# Patient Record
Sex: Female | Born: 1975 | Race: White | Hispanic: No | Marital: Married | Smoking: Never smoker
Health system: Southern US, Community
[De-identification: ages and names within clinical notes are randomized; demographics above are authoritative.]

## PROBLEM LIST (undated history)

## (undated) DIAGNOSIS — F329 Major depressive disorder, single episode, unspecified: Secondary | ICD-10-CM

## (undated) DIAGNOSIS — Z789 Other specified health status: Secondary | ICD-10-CM

## (undated) DIAGNOSIS — F32A Depression, unspecified: Secondary | ICD-10-CM

## (undated) HISTORY — PX: DILATION AND CURETTAGE OF UTERUS: SHX78

## (undated) HISTORY — PX: APPENDECTOMY: SHX54

## (undated) HISTORY — PX: CLOSED REDUCTION RADIAL / ULNAR SHAFT FRACTURE: SUR237

---

## 2003-01-27 ENCOUNTER — Other Ambulatory Visit: Payer: Self-pay

## 2006-04-16 ENCOUNTER — Encounter: Payer: Self-pay | Admitting: Obstetrics & Gynecology

## 2006-04-16 ENCOUNTER — Ambulatory Visit: Payer: Self-pay | Admitting: Obstetrics & Gynecology

## 2007-01-25 ENCOUNTER — Emergency Department (HOSPITAL_COMMUNITY): Admission: EM | Admit: 2007-01-25 | Discharge: 2007-01-25 | Payer: Self-pay | Admitting: Emergency Medicine

## 2007-01-25 ENCOUNTER — Ambulatory Visit: Payer: Self-pay | Admitting: Orthopedic Surgery

## 2007-01-26 ENCOUNTER — Ambulatory Visit: Payer: Self-pay | Admitting: Orthopedic Surgery

## 2007-01-26 DIAGNOSIS — S53106A Unspecified dislocation of unspecified ulnohumeral joint, initial encounter: Secondary | ICD-10-CM | POA: Insufficient documentation

## 2007-01-27 ENCOUNTER — Encounter: Payer: Self-pay | Admitting: Orthopedic Surgery

## 2007-01-27 ENCOUNTER — Inpatient Hospital Stay (HOSPITAL_COMMUNITY): Admission: RE | Admit: 2007-01-27 | Discharge: 2007-01-29 | Payer: Self-pay | Admitting: Orthopedic Surgery

## 2007-02-02 ENCOUNTER — Encounter: Payer: Self-pay | Admitting: Orthopedic Surgery

## 2007-02-09 ENCOUNTER — Encounter: Payer: Self-pay | Admitting: Orthopedic Surgery

## 2007-09-15 ENCOUNTER — Ambulatory Visit: Payer: Self-pay | Admitting: Nurse Practitioner

## 2010-01-23 ENCOUNTER — Emergency Department (HOSPITAL_COMMUNITY)
Admission: EM | Admit: 2010-01-23 | Discharge: 2010-01-23 | Payer: Self-pay | Source: Home / Self Care | Admitting: Emergency Medicine

## 2010-04-10 ENCOUNTER — Other Ambulatory Visit: Payer: Self-pay | Admitting: Obstetrics & Gynecology

## 2010-04-10 DIAGNOSIS — Z0489 Encounter for examination and observation for other specified reasons: Secondary | ICD-10-CM

## 2010-04-24 ENCOUNTER — Ambulatory Visit (HOSPITAL_COMMUNITY)
Admission: RE | Admit: 2010-04-24 | Discharge: 2010-04-24 | Disposition: A | Discharge status: Home / Self Care | Payer: Medicaid Other | Source: Ambulatory Visit | Attending: Obstetrics & Gynecology | Admitting: Obstetrics & Gynecology

## 2010-04-24 ENCOUNTER — Encounter (HOSPITAL_COMMUNITY): Payer: Self-pay

## 2010-04-24 DIAGNOSIS — Z0489 Encounter for examination and observation for other specified reasons: Secondary | ICD-10-CM

## 2010-04-24 DIAGNOSIS — O358XX0 Maternal care for other (suspected) fetal abnormality and damage, not applicable or unspecified: Secondary | ICD-10-CM | POA: Insufficient documentation

## 2010-04-24 DIAGNOSIS — O09529 Supervision of elderly multigravida, unspecified trimester: Secondary | ICD-10-CM | POA: Insufficient documentation

## 2010-04-24 LAB — BASIC METABOLIC PANEL
BUN: 8 mg/dL (ref 6–23)
CO2: 24 mEq/L (ref 19–32)
Calcium: 8.9 mg/dL (ref 8.4–10.5)
Glucose, Bld: 98 mg/dL (ref 70–99)

## 2010-04-25 ENCOUNTER — Encounter: Payer: Self-pay | Admitting: Advanced Practice Midwife

## 2010-04-25 DIAGNOSIS — IMO0002 Reserved for concepts with insufficient information to code with codable children: Secondary | ICD-10-CM

## 2010-04-25 DIAGNOSIS — O280 Abnormal hematological finding on antenatal screening of mother: Secondary | ICD-10-CM | POA: Insufficient documentation

## 2010-04-25 DIAGNOSIS — O039 Complete or unspecified spontaneous abortion without complication: Secondary | ICD-10-CM | POA: Insufficient documentation
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
## 2010-06-26 NOTE — Op Note (Signed)
Amanda Roth, Amanda Roth             ACCOUNT NO.:  [id number]   MEDICAL RECORD NO.:  [id number]          PATIENT TYPE:  OIB   LOCATION:  3011                         FACILITY:  [facility]   PHYSICIAN:  Dionne Ano. Gramig III, M.D.DATE OF BIRTH:  [birth year]   DATE OF PROCEDURE:  01/27/2007  DATE OF DISCHARGE:                               OPERATIVE REPORT   PREOPERATIVE DIAGNOSIS:  Fracture dislocation of left elbow with  comminuted radial head and instability.   POSTOPERATIVE DIAGNOSIS:  Fracture dislocation of left elbow with  comminuted radial head and instability.   PROCEDURE:  1. Open reduction, left elbow dislocation.  2. Radial head arthroplasty, left elbow.  3. Arthrotomy with loose body removal in the form of multiple bone      chips, left elbow.  4. Lateral ulnar collateral ligament repair, left elbow.  5. Stress radiography, left upper extremity.   SURGEON:  Dominica Severin, MD   ASSISTANT:  Karie Chimera, PA-C   COMPLICATIONS:  None.   ANESTHESIA:  General.   DRAINS:  None.   ESTIMATED BLOOD LOSS:  Minimal.   INDICATIONS FOR PROCEDURE:  Amanda Roth is a 35 year old female who  was referred by Dr. Fuller Canada of Orthopedics in [place] at  [facility] after the patient sustained a severe fracture  dislocation to the elbow.  I was asked to take over her care for her  upper extremity predicament as noted above.  She has an unstable  elbow/comminuted radial head fracture and I discussed with her the risks  and benefits of surgery and options for treatment.  I discussed the risk  of heterotopic ossification, bleeding, infection, anesthesia, damage to  normal structures and failure of surgery to accomplish its intended  goals of relieving symptoms and restoring function.  All issues were  addressed preoperatively.   OPERATION IN DETAIL:  The patient was seen by myself and anesthesia,  taken to the operative suite and underwent a smooth induction of  general  anesthesia.  Permit was signed.  Arm was marked.  Extensive preoperative  counseling and preoperative antibiotics were placed.  Following this,  the patient was prepped and draped in the usual sterile fashion after  general anesthesia was secured and a Kocher incision was made about the  left elbow after a sterile field was secured.  Once this was completed,  I then dissected down and interval between the anconeus and ECU was  created.  Dissection was carried down and the lateral ulnar collateral  ligament was identified and was torn.  Following this, I then performed  identification of the joint and arthrotomy was made.  I very carefully  placed retractors in the area and remove multiple loose bodies including  radial head fragments and a small loose body from the coronoid.  The  coronoid was generally stable, but had a slight chip off of the chondral  debris; the joint thus underwent arthrotomy with multiple loose body  removals including the radial head fragments.  Following lavage, I then  performed reduction of the elbow, as it was quite unstable, and assessed  the stability of the  coronoid.  Once this was complete, I then of course  made a decision to perform radial head arthroplasty.  The patient  underwent placement of an AcuMed size 6 stem, 20-mm head; this was done  in standard fashion utilizing a broach about the canal, followed by  sizing, trial sizing and final placement of the implant with atraumatic  technique.  This fit very nicely and I was quite pleased with this.  X-  rays were taken; AP and lateral and stress radiographs were reviewed,  which looked excellent in the AP and lateral plane.  I was pleased with  the cut, which was beveled, and all findings.  The patient tolerated  this very well.  Once this was done, I then performed copious irrigation  to the area and repaired the lateral ulnar collateral ligament with #2  FiberWire in an interrupted buried  fashion.   I should note that the oscillating saw was used to bevel the radial neck  and a planer was used as well according to standard technique.   Following repair of the ligament, I then performed closure of the subcu  with Vicryl and closure of the skin edge with subcuticular Prolene.  Sensorcaine without epinephrine was placed on the wound for postop  analgesia and she was placed in a long arm splint after sterile dressing  was applied with the elbow at 90 and the wrist in pronation (forearm in  pronation).   She will be admitted for observation, IV antibiotics, pain management  and we will monitor her condition closely.  I am going to have her  notify us should any problems occur.  Otherwise, we will plan to see her  back in the office in 10-14 days, at which time we will remove sutures  and embark on a therapy program.  I will allow her full flexion and  extension to 70 degrees and weekly we will bring her down 10-15 degrees  so that we get full extension by 6-8 weeks postop.  No strengthening  until she is limited to 12 weeks and no heavy lifting until 3 months  out.  This is a very severe fracture dislocation and I want to make sure  she does very well.  I am not going to restrictive her pronation and  supination initially, as I was quite pleased with the ligamentous repair  and do not want to get overly stiff.  All questions have been encouraged  and answered.           ______________________________  Dionne Ano. Everlene Other, M.D.     Nash Mantis  D:  01/27/2007  T:  01/28/2007  Job:  [id number]   cc:   Vickki Hearing, M.D.

## 2010-08-27 ENCOUNTER — Other Ambulatory Visit: Payer: Self-pay | Admitting: Obstetrics & Gynecology

## 2010-08-27 NOTE — H&P (Addendum)
Preoperative History and Physical  Amanda Roth is a 35 y.o. [id number] with E DD 09/29/2010 by early sonogram. She isadmitted for a primary caeserean section for complete placenta previa. She was in the hospital at [facility] for 2 weeks for 2 bleeding episodes.  She received Betamethasone x 2.  Per recommendation with the MFM staff, she is admitted for elective early c section.  PMH:   Negative  PSH:     Past Surgical History  Procedure Date  . Dilation and curettage of uterus   . Appendectomy   . Closed reduction radial / ulnar shaft fracture     POb/GynH:      OB History    Grav Para Term Preterm Abortions TAB SAB Ect Mult Living   4 2 2  0 1 0 0 0 0 2      SH:   History  Substance Use Topics  . Smoking status: Never Smoker   . Smokeless tobacco: Not on file  . Alcohol Use: No    FH:    Family History  Problem Relation Age of Onset  . Hypertension Other   . Diabetes type II Other   . Coronary artery disease Other      Allergies:   NKDA  Medications:      Prenatal Vitamins  Review of Systems: - History obtained from the patient General ROS: negative Cardiovascular ROS: no chest pain or dyspnea on exertion All other systems negative  PHYSICAL EXAM:    General: General appearance - alert, well appearing, and in no distress Neck - supple, no significant adenopathy Chest - clear to auscultation, no wheezes, rales or rhonchi, symmetric air entry Heart - normal rate, regular rhythm, normal S1, S2, no murmurs, rubs, clicks or gallops Abdomen - no CVA tenderness Gravid fundal; height 36 cm Pelvic - normal external genitalia, vulva, vagina, cervix, uterus and adnexa Neurological - alert, oriented, normal speech, no focal findings or movement disorder noted Extremities - peripheral pulses normal, no pedal edema, no clubbing or cyanosis, pedal edema 2 + Focused Gynecological Exam: normal external genitalia, vulva, vagina, cervix, uterus and adnexa, CERVIX: not  assessed  Labs: Prenatal:  O positive, Antibody screen negative, Rubella negative, HepBsAG negative, HIV negative x 2, Serology negative x 2, 2 hour glucola 77/137/125, Hgb/HCT 11.6/35.8, Pap normal  EKG: Orders placed during the hospital encounter of 01/23/10  . EKG    Imaging Studies: No results found.    Assessment: 1.  IUP at 36 weeks 2 days 2.  Complete Placenta previa  Plan: Primary caeserean section with bilateral tubal ligation.  Pt understands the risks of surgery including but not limited t  excessive bleeding requiring transfusion or reoperation, post-operative infection requiring prolonged hospitalization or re-hospitalization and antibiotic therapy, and damage to other organs including bladder, bowel, ureters and major vessels.   Also risks of prematurity.  EURE,LUTHER H

## 2010-08-29 ENCOUNTER — Other Ambulatory Visit: Payer: Self-pay | Admitting: Obstetrics & Gynecology

## 2010-08-31 ENCOUNTER — Encounter (HOSPITAL_COMMUNITY): Payer: Self-pay

## 2010-08-31 ENCOUNTER — Encounter (HOSPITAL_COMMUNITY)
Admission: RE | Admit: 2010-08-31 | Discharge: 2010-08-31 | Disposition: A | Discharge status: Home / Self Care | Payer: 59 | Source: Ambulatory Visit | Attending: Obstetrics & Gynecology | Admitting: Obstetrics & Gynecology

## 2010-08-31 HISTORY — DX: Other specified health status: Z78.9

## 2010-08-31 LAB — CBC
Hemoglobin: 11.7 g/dL — ABNORMAL LOW (ref 12.0–15.0)
MCH: 30.5 pg (ref 26.0–34.0)
MCHC: 33.5 g/dL (ref 30.0–36.0)
Platelets: 190 10*3/uL (ref 150–400)
RBC: 3.84 MIL/uL — ABNORMAL LOW (ref 3.87–5.11)
RDW: 14.5 % (ref 11.5–15.5)

## 2010-08-31 LAB — SURGICAL PCR SCREEN
MRSA, PCR: NEGATIVE
Staphylococcus aureus: NEGATIVE

## 2010-08-31 LAB — RPR: RPR Ser Ql: NONREACTIVE

## 2010-08-31 NOTE — Patient Instructions (Signed)
20 Amanda Roth  08/31/2010   Your procedure is scheduled on:  09/03/10  Report to [facility] at 0700 AM.  Call this number if you have problems the morning of surgery: [phone]   Remember:   Do not eat food:After Midnight.  Do not drink clear liquids: After Midnight.  Take these medicines the morning of surgery with A SIP OF WATER: none   Do not wear jewelry, make-up or nail polish.  Do not bring valuables to the hospital.  Contacts, dentures or bridgework may not be worn into surgery.  Leave suitcase in the car. After surgery it may be brought to your room.  For patients admitted to the hospital, checkout time is 11:00 AM the day of discharge.   Patients discharged the day of surgery will not be allowed to drive home.  Name and phone number of your driver: spouseCristal Deer- [phone]  Special Instructions: N/A   Please read over the following fact sheets that you were given:

## 2010-09-03 ENCOUNTER — Inpatient Hospital Stay (HOSPITAL_COMMUNITY)
Admission: RE | Admit: 2010-09-03 | Discharge: 2010-09-06 | DRG: 765 | Disposition: A | Discharge status: Home / Self Care | Payer: 59 | Source: Ambulatory Visit | Attending: Obstetrics & Gynecology | Admitting: Obstetrics & Gynecology

## 2010-09-03 ENCOUNTER — Encounter (HOSPITAL_COMMUNITY): Payer: Self-pay | Admitting: Cardiology

## 2010-09-03 ENCOUNTER — Encounter (HOSPITAL_COMMUNITY)
Admission: RE | Disposition: A | Discharge status: Home / Self Care | Payer: Self-pay | Source: Ambulatory Visit | Attending: Obstetrics & Gynecology

## 2010-09-03 ENCOUNTER — Encounter (HOSPITAL_COMMUNITY): Payer: Self-pay | Admitting: Anesthesiology

## 2010-09-03 ENCOUNTER — Inpatient Hospital Stay (HOSPITAL_COMMUNITY): Payer: 59 | Admitting: Anesthesiology

## 2010-09-03 ENCOUNTER — Other Ambulatory Visit: Payer: Self-pay | Admitting: Obstetrics & Gynecology

## 2010-09-03 DIAGNOSIS — O4400 Placenta previa specified as without hemorrhage, unspecified trimester: Secondary | ICD-10-CM

## 2010-09-03 DIAGNOSIS — O09529 Supervision of elderly multigravida, unspecified trimester: Secondary | ICD-10-CM

## 2010-09-03 DIAGNOSIS — Z302 Encounter for sterilization: Secondary | ICD-10-CM

## 2010-09-03 LAB — PREPARE RBC (CROSSMATCH)

## 2010-09-03 SURGERY — Surgical Case
Anesthesia: Regional | Laterality: Bilateral

## 2010-09-03 MED ORDER — OXYTOCIN 10 UNIT/ML IJ SOLN
INTRAMUSCULAR | Status: AC
  Filled 2010-09-03: qty 2

## 2010-09-03 MED ORDER — DIPHENHYDRAMINE HCL 50 MG/ML IJ SOLN: 12.5000 mg | INTRAMUSCULAR | Status: DC | PRN

## 2010-09-03 MED ORDER — PHENYLEPHRINE 40 MCG/ML (10ML) SYRINGE FOR IV PUSH (FOR BLOOD PRESSURE SUPPORT)
PREFILLED_SYRINGE | INTRAVENOUS | Status: AC
  Filled 2010-09-03: qty 10

## 2010-09-03 MED ORDER — PRENATAL PLUS 27-1 MG PO TABS
1.0000 | ORAL_TABLET | Freq: Every day | ORAL | Status: DC
  Administered 2010-09-04 – 2010-09-06 (×3): 1 via ORAL
  Filled 2010-09-03 (×2): qty 1

## 2010-09-03 MED ORDER — SIMETHICONE 80 MG PO CHEW
80.0000 mg | CHEWABLE_TABLET | Freq: Three times a day (TID) | ORAL | Status: DC
  Administered 2010-09-03 – 2010-09-06 (×8): 80 mg via ORAL

## 2010-09-03 MED ORDER — EPHEDRINE SULFATE 50 MG/ML IJ SOLN
INTRAMUSCULAR | Status: DC | PRN
  Administered 2010-09-03: 10 mg via INTRAVENOUS
  Administered 2010-09-03: 5 mg via INTRAVENOUS
  Administered 2010-09-03: 10 mg via INTRAVENOUS
  Administered 2010-09-03: 5 mg via INTRAVENOUS

## 2010-09-03 MED ORDER — TETANUS-DIPHTH-ACELL PERTUSSIS 5-2.5-18.5 LF-MCG/0.5 IM SUSP
0.5000 mL | Freq: Once | INTRAMUSCULAR | Status: AC
  Administered 2010-09-04: 0.5 mL via INTRAMUSCULAR
  Filled 2010-09-03: qty 0.5

## 2010-09-03 MED ORDER — PHENYLEPHRINE HCL 10 MG/ML IJ SOLN
INTRAMUSCULAR | Status: DC | PRN
  Administered 2010-09-03 (×6): 40 ug via INTRAVENOUS

## 2010-09-03 MED ORDER — OXYCODONE-ACETAMINOPHEN 5-325 MG PO TABS
1.0000 | ORAL_TABLET | ORAL | Status: DC | PRN
  Administered 2010-09-04 – 2010-09-05 (×3): 1 via ORAL
  Filled 2010-09-03 (×3): qty 1

## 2010-09-03 MED ORDER — EPHEDRINE 5 MG/ML INJ
INTRAVENOUS | Status: AC
  Filled 2010-09-03: qty 10

## 2010-09-03 MED ORDER — NALBUPHINE SYRINGE 5 MG/0.5 ML
5.0000 mg | INJECTION | INTRAMUSCULAR | Status: AC | PRN
  Filled 2010-09-03: qty 1

## 2010-09-03 MED ORDER — OXYTOCIN 10 UNIT/ML IJ SOLN
INTRAMUSCULAR | Status: DC | PRN
  Administered 2010-09-03 (×2): 20 [IU] via INTRAMUSCULAR

## 2010-09-03 MED ORDER — SODIUM CHLORIDE 0.9 % IR SOLN
Status: DC | PRN
  Administered 2010-09-03: 1000 mL

## 2010-09-03 MED ORDER — NALBUPHINE SYRINGE 5 MG/0.5 ML
5.0000 mg | INJECTION | INTRAMUSCULAR | Status: AC | PRN
  Administered 2010-09-03: 5 mg via INTRAVENOUS
  Filled 2010-09-03: qty 1

## 2010-09-03 MED ORDER — CEFAZOLIN SODIUM 1-5 GM-% IV SOLN: 1.0000 g | INTRAVENOUS | Status: DC

## 2010-09-03 MED ORDER — SENNOSIDES-DOCUSATE SODIUM 8.6-50 MG PO TABS
1.0000 | ORAL_TABLET | Freq: Every day | ORAL | Status: DC
  Administered 2010-09-03 – 2010-09-05 (×3): 2 via ORAL

## 2010-09-03 MED ORDER — SODIUM CHLORIDE 0.9 % IV SOLN
1.0000 ug/kg/h | INTRAVENOUS | Status: DC | PRN
  Filled 2010-09-03: qty 2.5

## 2010-09-03 MED ORDER — MENTHOL 3 MG MT LOZG: 1.0000 | LOZENGE | OROMUCOSAL | Status: DC | PRN

## 2010-09-03 MED ORDER — MEPERIDINE HCL 25 MG/ML IJ SOLN: 6.2500 mg | INTRAMUSCULAR | Status: DC | PRN

## 2010-09-03 MED ORDER — IBUPROFEN 600 MG PO TABS
600.0000 mg | ORAL_TABLET | Freq: Four times a day (QID) | ORAL | Status: DC
  Administered 2010-09-04 – 2010-09-06 (×11): 600 mg via ORAL
  Filled 2010-09-03 (×11): qty 1

## 2010-09-03 MED ORDER — WITCH HAZEL-GLYCERIN EX PADS: MEDICATED_PAD | CUTANEOUS | Status: DC | PRN

## 2010-09-03 MED ORDER — SIMETHICONE 80 MG PO CHEW: 80.0000 mg | CHEWABLE_TABLET | ORAL | Status: DC | PRN

## 2010-09-03 MED ORDER — ONDANSETRON HCL 4 MG/2ML IJ SOLN: 4.0000 mg | Freq: Three times a day (TID) | INTRAMUSCULAR | Status: DC | PRN

## 2010-09-03 MED ORDER — SODIUM CHLORIDE 0.9 % IJ SOLN: 3.0000 mL | INTRAMUSCULAR | Status: DC | PRN

## 2010-09-03 MED ORDER — ONDANSETRON HCL 4 MG/2ML IJ SOLN: 4.0000 mg | INTRAMUSCULAR | Status: DC | PRN

## 2010-09-03 MED ORDER — CEFAZOLIN SODIUM 1-5 GM-% IV SOLN
INTRAVENOUS | Status: AC
  Administered 2010-09-03: 1 g via INTRAVENOUS
  Filled 2010-09-03: qty 50

## 2010-09-03 MED ORDER — KETOROLAC TROMETHAMINE 60 MG/2ML IM SOLN
INTRAMUSCULAR | Status: AC
  Administered 2010-09-03: 60 mg via INTRAMUSCULAR
  Filled 2010-09-03: qty 2

## 2010-09-03 MED ORDER — BUPIVACAINE HCL 0.75 % IJ SOLN
INTRAMUSCULAR | Status: DC | PRN
  Administered 2010-09-03: 11.25 mg via INTRATHECAL

## 2010-09-03 MED ORDER — KETOROLAC TROMETHAMINE 60 MG/2ML IM SOLN
60.0000 mg | Freq: Once | INTRAMUSCULAR | Status: AC | PRN
  Administered 2010-09-03: 60 mg via INTRAMUSCULAR

## 2010-09-03 MED ORDER — NALOXONE HCL 0.4 MG/ML IJ SOLN: 0.4000 mg | INTRAMUSCULAR | Status: DC | PRN

## 2010-09-03 MED ORDER — DIPHENHYDRAMINE HCL 50 MG/ML IJ SOLN: 25.0000 mg | INTRAMUSCULAR | Status: DC | PRN

## 2010-09-03 MED ORDER — MORPHINE SULFATE 0.5 MG/ML IJ SOLN
INTRAMUSCULAR | Status: AC
  Filled 2010-09-03: qty 10

## 2010-09-03 MED ORDER — SCOPOLAMINE 1 MG/3DAYS TD PT72
1.0000 | MEDICATED_PATCH | TRANSDERMAL | Status: DC
  Administered 2010-09-03: 1.5 mg via TRANSDERMAL

## 2010-09-03 MED ORDER — METOCLOPRAMIDE HCL 5 MG/ML IJ SOLN
INTRAMUSCULAR | Status: AC
  Administered 2010-09-03: 10 mg via INTRAVENOUS
  Filled 2010-09-03: qty 2

## 2010-09-03 MED ORDER — LACTATED RINGERS IV SOLN
INTRAVENOUS | Status: DC
  Administered 2010-09-03: 22:00:00 via INTRAVENOUS

## 2010-09-03 MED ORDER — KETOROLAC TROMETHAMINE 30 MG/ML IJ SOLN: 30.0000 mg | Freq: Four times a day (QID) | INTRAMUSCULAR | Status: AC | PRN

## 2010-09-03 MED ORDER — ZOLPIDEM TARTRATE 5 MG PO TABS: 5.0000 mg | ORAL_TABLET | Freq: Every evening | ORAL | Status: DC | PRN

## 2010-09-03 MED ORDER — FENTANYL CITRATE 0.05 MG/ML IJ SOLN
INTRAMUSCULAR | Status: AC
  Filled 2010-09-03: qty 2

## 2010-09-03 MED ORDER — ONDANSETRON HCL 4 MG/2ML IJ SOLN
INTRAMUSCULAR | Status: DC | PRN
  Administered 2010-09-03: 4 mg via INTRAVENOUS

## 2010-09-03 MED ORDER — IBUPROFEN 600 MG PO TABS
600.0000 mg | ORAL_TABLET | Freq: Four times a day (QID) | ORAL | Status: DC | PRN
  Filled 2010-09-03: qty 1

## 2010-09-03 MED ORDER — DIPHENHYDRAMINE HCL 25 MG PO CAPS
25.0000 mg | ORAL_CAPSULE | ORAL | Status: DC | PRN
  Filled 2010-09-03: qty 1

## 2010-09-03 MED ORDER — ONDANSETRON HCL 4 MG/2ML IJ SOLN
INTRAMUSCULAR | Status: AC
  Filled 2010-09-03: qty 2

## 2010-09-03 MED ORDER — ONDANSETRON HCL 4 MG PO TABS: 4.0000 mg | ORAL_TABLET | ORAL | Status: DC | PRN

## 2010-09-03 MED ORDER — LACTATED RINGERS IV SOLN
INTRAVENOUS | Status: DC
  Administered 2010-09-03 (×3): via INTRAVENOUS
  Administered 2010-09-03: 20 mL/h via INTRAVENOUS
  Administered 2010-09-03 (×2): via INTRAVENOUS

## 2010-09-03 MED ORDER — MORPHINE SULFATE (PF) 0.5 MG/ML IJ SOLN
INTRAMUSCULAR | Status: DC | PRN
  Administered 2010-09-03: .1 mg via INTRATHECAL
  Administered 2010-09-03: 4.9 mg via INTRAVENOUS

## 2010-09-03 MED ORDER — OXYTOCIN 20 UNITS IN LACTATED RINGERS INFUSION - SIMPLE: 125.0000 mL/h | INTRAVENOUS | Status: AC

## 2010-09-03 MED ORDER — DIPHENHYDRAMINE HCL 25 MG PO CAPS: 25.0000 mg | ORAL_CAPSULE | Freq: Four times a day (QID) | ORAL | Status: DC | PRN

## 2010-09-03 MED ORDER — METOCLOPRAMIDE HCL 5 MG/ML IJ SOLN: 10.0000 mg | Freq: Three times a day (TID) | INTRAMUSCULAR | Status: DC | PRN

## 2010-09-03 MED ORDER — FENTANYL CITRATE 0.05 MG/ML IJ SOLN
INTRAMUSCULAR | Status: DC | PRN
  Administered 2010-09-03: 15 ug via INTRATHECAL
  Administered 2010-09-03: 85 ug via INTRAVENOUS

## 2010-09-03 SURGICAL SUPPLY — 32 items
CLOTH BEACON ORANGE TIMEOUT ST (SAFETY) ×2 IMPLANT
DERMABOND ADVANCED (GAUZE/BANDAGES/DRESSINGS) ×4 IMPLANT
DRAPE UTILITY XL STRL (DRAPES) ×2 IMPLANT
DURAPREP 26ML APPLICATOR (WOUND CARE) ×4 IMPLANT
ELECT REM PT RETURN 9FT ADLT (ELECTROSURGICAL) ×2
ELECTRODE REM PT RTRN 9FT ADLT (ELECTROSURGICAL) ×1 IMPLANT
EXTRACTOR VACUUM BELL STYLE (SUCTIONS) IMPLANT
EXTRACTOR VACUUM KIWI (MISCELLANEOUS) ×2 IMPLANT
GLOVE BIOGEL PI IND STRL 8 (GLOVE) ×2 IMPLANT
GLOVE BIOGEL PI INDICATOR 8 (GLOVE) ×2
GLOVE ECLIPSE 8.0 STRL XLNG CF (GLOVE) ×2 IMPLANT
GOWN BRE IMP SLV AUR XL STRL (GOWN DISPOSABLE) ×6 IMPLANT
KIT ABG SYR 3ML LUER SLIP (SYRINGE) ×2 IMPLANT
NDL HYPO 25X5/8 SAFETYGLIDE (NEEDLE) ×1 IMPLANT
NEEDLE HYPO 25X5/8 SAFETYGLIDE (NEEDLE) ×2 IMPLANT
NS IRRIG 1000ML POUR BTL (IV SOLUTION) ×2 IMPLANT
PACK C SECTION WH (CUSTOM PROCEDURE TRAY) ×2 IMPLANT
RTRCTR C-SECT PINK 25CM LRG (MISCELLANEOUS) IMPLANT
SLEEVE SCD COMPRESS KNEE MED (MISCELLANEOUS) IMPLANT
STAPLER VISISTAT 35W (STAPLE) IMPLANT
SUT CHROMIC 0 CT 1 (SUTURE) ×2 IMPLANT
SUT MNCRL 0 VIOLET CTX 36 (SUTURE) ×2 IMPLANT
SUT MONOCRYL 0 CTX 36 (SUTURE) ×2
SUT PLAIN 2 0 (SUTURE)
SUT PLAIN 2 0 XLH (SUTURE) IMPLANT
SUT PLAIN ABS 2-0 CT1 27XMFL (SUTURE) IMPLANT
SUT VIC AB 0 CTX 36 (SUTURE) ×2
SUT VIC AB 0 CTX36XBRD ANBCTRL (SUTURE) ×1 IMPLANT
SUT VIC AB 4-0 KS 27 (SUTURE) IMPLANT
TOWEL OR 17X24 6PK STRL BLUE (TOWEL DISPOSABLE) ×4 IMPLANT
TRAY FOLEY CATH 14FR (SET/KITS/TRAYS/PACK) IMPLANT
WATER STERILE IRR 1000ML POUR (IV SOLUTION) ×2 IMPLANT

## 2010-09-03 NOTE — Anesthesia Procedure Notes (Signed)
Spinal Block  Patient location during procedure: OR Start time: 09/03/2010 9:52 AM Staffing Anesthesiologist: CASSIDY, AMY L. Performed by: anesthesiologist  Preanesthetic Checklist Completed: patient identified, site marked, surgical consent, pre-op evaluation, timeout performed, IV checked, risks and benefits discussed and monitors and equipment checked Spinal Block Patient position: sitting Prep: DuraPrep Patient monitoring: heart rate, cardiac monitor, continuous pulse ox and blood pressure Approach: midline Location: L3-4 Injection technique: single-shot Needle Needle type: Sprotte  Needle gauge: 24 G Needle length: 5 cm Assessment Sensory level: T4

## 2010-09-03 NOTE — Anesthesia Postprocedure Evaluation (Signed)
  Anesthesia Post-op Note  Patient: Amanda Roth  Procedure(s) Performed:  CESAREAN SECTION WITH BILATERAL TUBAL LIGATION   Patient is awake, responsive, moving her legs, and has signs of resolution of her numbness. Pain and nausea are reasonably well controlled. Vital signs are stable and clinically acceptable. Oxygen saturation is clinically acceptable. There are no apparent anesthetic complications at this time. Patient is ready for discharge.

## 2010-09-03 NOTE — Procedures (Signed)
Cesarean Section Procedure Note   Amanda Roth  09/03/2010  Indications: Placenta Previa   Pre-operative Diagnosis: 36 weeks complete placenta previa.   Post-operative Diagnosis: Same   Surgeon: Surgeon(s) and Role:    * Scheryl Darter, MD - Primary   Assistants: Lucina Mellow, DO  Anesthesia: spinal   Procedure Details:  The patient was seen in the Holding Room. The risks, benefits, complications, treatment options, and expected outcomes were discussed with the patient. The patient concurred with the proposed plan, giving informed consent. identified as [place] Elders and the procedure verified as C-Section Delivery. A Time Out was held and the above information confirmed.  After induction of anesthesia, the patient was draped and prepped in the usual sterile manner. A transverse was made and carried down through the subcutaneous tissue to the fascia. Fascial incision was made and extended transversely. The fascia was separated from the underlying rectus tissue superiorly and inferiorly. The peritoneum was identified and entered. Peritoneal incision was extended longitudinally. The utero-vesical peritoneal reflection was incised transversely and the bladder flap was bluntly freed from the lower uterine segment. A low transverse uterine incision was made. Delivered from cephalic presentation was a 6 lbs 1.4 oz Living newborn infant femalle with Apgar scores of 1 at one minute and 9 at five minutes. Cord ph was not sent the umbilical cord was clamped and cut cord blood was obtained for evaluation. The placenta was removed Intact and appeared normal. The uterine outline, tubes and ovaries appeared normal}. The uterine incision was closed with running locked sutures of 0Vicryl. A second layer of the same was done and hemostasis was observed.  Attention was then turned to the patient's Left ovary and tube; the tube was followed out to the fimbria with Babcock clamps. A Filshie clip was placed,  hemostasis was noted. The patient's Right ovary and tube were then identified in similar fashion, grasped with Babcock clamps, and a Filshie clip was placed. Hemostasis was noted, and tube was returned to the abdomen.  The peritoneum was closed with 2-0 Vicryl. The fascia was then reapproximated with running sutures of 0 Vicryl. The subcuticular closure was performed using 2plain gut. The skin was closed with 4-0Vicryl.   Instrument, sponge, and needle counts were correct prior the abdominal closure and were correct at the conclusion of the case.    Findings: Viable infant female, weighing 6 lbs 1.4 oz, normal uterus, tubes and ovaries.    Estimated Blood Loss:  Total IV Fluids:   Urine Output: 200CC OF clear urine  Specimens:  Specimens    Placenta to pathology       Complications: no complications  Disposition: PACU - hemodynamically stable.   Maternal Condition: stable   Baby condition / location:  nursery-stable  Attending Attestation: I was present and scrubbed for the entire procedure.   Signed: Surgeon(s): Scheryl Darter, MD

## 2010-09-03 NOTE — Transfer of Care (Signed)
Immediate Anesthesia Transfer of Care Note  Patient: Amanda Roth  Procedure(s) Performed:  CESAREAN SECTION WITH BILATERAL TUBAL LIGATION  Patient Location: PACU  Anesthesia Type: Spinal  Level of Consciousness: awake, alert  and oriented  Airway & Oxygen Therapy: Patient Spontanous Breathing  Post-op Assessment: Report given to PACU RN and Post -op Vital signs reviewed and stable  Post vital signs: Reviewed and stable  Complications: No apparent anesthesia complications

## 2010-09-03 NOTE — Consult Note (Signed)
Called to attend scheduled repeat C/section at 36+ wks EGA for 35 y.o. [id number] with E DD 09/29/2010 by early sonogram with complete placenta previa. She was in the hospital at [facility] for 2 weeks for 2 bleeding episodes. She received Betamethasone x 2. Per recommendation with the MFM staff, she is admitted for elective early c section.  AROM with clear fluid at delivery, vertex extraction with loose nuchal cord x 2.  Infant depressed at birth with hypotonia and apnea, HR < 100.  No improvement after stimulation and bulb suction. PPV given x 1 minute via bag/mask, with immediate increase in HR to > 100 followed shortly by onset of respiratory effort and crying.  By 5 minutes good tone, etc. Apgars 1/9.  Shown to mother then taken to CN for further observation, care per Peds Teaching Service (f/u [place]).  JWimmer,MD

## 2010-09-03 NOTE — Anesthesia Preprocedure Evaluation (Addendum)
Anesthesia Evaluation  Name, MR# and DOB Patient awake  General Assessment Comment  Reviewed: Allergy & Precautions, H&P  and Patient's Chart, lab work & pertinent test results  History of Anesthesia Complications Negative for: history of anesthetic complications  Airway Mallampati: III TM Distance: >3 FB Neck ROM: full    Dental  (+) Teeth Intact   Pulmonaryneg pulmonary ROS      pulmonary exam normal   Cardiovascular regular Normal   Neuro/PsychNegative Neurological ROS Negative Psych ROS  GI/Hepatic/Renal negative Liver ROS, and negative Renal ROS (+)  GERD (with pregnancy) Medicated and Controlled     Endo/Other  Negative Endocrine ROS (+)   Abdominal   Musculoskeletal  Hematology negative hematology ROS (+)   Peds  Reproductive/Obstetrics (+) Pregnancy   Anesthesia Other Findings             Anesthesia Physical Anesthesia Plan  ASA: II  Anesthesia Plan: Spinal   Post-op Pain Management:    Induction:   Airway Management Planned:   Additional Equipment:   Intra-op Plan:   Post-operative Plan:   Informed Consent:   Plan Discussed with:   Anesthesia Plan Comments:         Anesthesia Quick Evaluation

## 2010-09-04 NOTE — Progress Notes (Signed)
Subjective: Postpartum Day 1: Cesarean Delivery Patient reports incisional pain and tolerating PO.    Objective: Vital signs in last 24 hours: Temp:  [97.3 F (36.3 C)-98.7 F (37.1 C)] 98.2 F (36.8 C) (07/24 0545) Pulse Rate:  [61-101] 73  (07/24 0545) Resp:  [16-20] 16  (07/24 0545) BP: (91-127)/(50-81) 93/50 mmHg (07/24 0545) SpO2:  [93 %-100 %] 95 % (07/24 0545) Weight:  [81.647 kg (180 lb)] 180 lb (81.647 kg) (07/23 1700)  Physical Exam:  General: alert, cooperative, appears stated age and no distress Lochia: appropriate Uterine Fundus: firm Incision: dressing dry and intact. DVT Evaluation: No evidence of DVT seen on physical exam. Negative Homan's sign. No cords or calf tenderness. No significant calf/ankle edema.  No results found for this basename: HGB:2,HCT:2 in the last 72 hours  Assessment/Plan: Status post Cesarean section. Doing well postoperatively.  Continue current care.  Amanda Roth 09/04/2010, 6:26 AM

## 2010-09-05 LAB — CBC
HCT: 26.1 % — ABNORMAL LOW (ref 36.0–46.0)
Hemoglobin: 8.5 g/dL — ABNORMAL LOW (ref 12.0–15.0)
MCV: 91.9 fL (ref 78.0–100.0)
RBC: 2.84 MIL/uL — ABNORMAL LOW (ref 3.87–5.11)
WBC: 13.4 10*3/uL — ABNORMAL HIGH (ref 4.0–10.5)

## 2010-09-05 NOTE — Progress Notes (Signed)
  Subjective: Postpartum Day 2: Cesarean Delivery Patient reports tolerating PO, + flatus and no problems voiding.  Has not yet had bowel movement. Abdominal cramping controled with medicine. Would like to breast feed. Denies any dizziness. Ambulating.   Objective: Vital signs in last 24 hours: Temp:  [97.8 F (36.6 C)-99.1 F (37.3 C)] 98.1 F (36.7 C) (07/25 0455) Pulse Rate:  [78-86] 78  (07/25 0455) Resp:  [18-19] 18  (07/25 0455) BP: (93-112)/(60-66) 95/61 mmHg (07/25 0455)  Physical Exam:  General: alert, cooperative and no distress Lochia: appropriate Uterine Fundus: firm, at umbilicus Incision: clean, dry, mild erythema, no discharge, no dehiscence. DVT Evaluation: No evidence of DVT seen on physical exam. Negative Homan's sign.  No results found for this basename: HGB:2,HCT:2 in the last 72 hours  Assessment/Plan: Status post Cesarean section. Doing well postoperatively.  Lactation consult.  Continue current care.  [name] 09/05/2010, 7:30 AM

## 2010-09-06 MED ORDER — BISACODYL 10 MG RE SUPP: 10.0000 mg | Freq: Every day | RECTAL | Status: DC | PRN

## 2010-09-06 MED ORDER — FERROUS SULFATE 325 (65 FE) MG PO TABS: 325.0000 mg | ORAL_TABLET | Freq: Two times a day (BID) | ORAL | Status: DC

## 2010-09-06 MED ORDER — OXYCODONE-ACETAMINOPHEN 5-325 MG PO TABS: 1.0000 | ORAL_TABLET | ORAL | Status: AC | PRN

## 2010-09-06 MED ORDER — IBUPROFEN 600 MG PO TABS: 600.0000 mg | ORAL_TABLET | Freq: Four times a day (QID) | ORAL | Status: AC

## 2010-09-06 NOTE — Progress Notes (Signed)
Mom has just finished BF baby. States BF going well. No concerns at present. Reports that breasts are feeling fuller this am. To call prn.

## 2010-09-06 NOTE — Discharge Summary (Signed)
  Obstetric Discharge Summary Reason for Admission: cesarean section with complete placenta previa Prenatal Procedures: ultrasound Intrapartum Procedures: cesarean: low cervical, transverse (primary) and BTL Postpartum Procedures: P.P. tubal ligation Complications-Operative and Postpartum: none  Hemoglobin  Date Value Range Status  09/05/2010 8.5* 12.0-15.0 (g/dL) Final     HCT  Date Value Range Status  09/05/2010 26.1* 36.0-46.0 (%) Final    Discharge Diagnoses: Pre-term pregnancy delivered  Discharge Information: Date: 09/06/2010 Activity: pelvic rest Diet: routine Medications: Ibuprophen, Colace, Iron and Percocet Condition: stable Instructions: refer to practice specific booklet Breastfeeding Discharge to: home Follow-up Information    Follow up with [facility] in 5 weeks. (for postpartum checkup)    Contact information:   [address] [place] [address]          Newborn Data: Live born  Information for the patient's newborn:  Lucendia, Leard [[id number]]  female ; APGAR 1 at and 9 at , ; weight 6lb14 ;  Home with mother.  [name] 09/06/2010, 8:29 AM

## 2010-09-06 NOTE — Progress Notes (Signed)
  Subjective: Postpartum Day 3 Cesarean Delivery and BTL Patient reports tolerating PO, + flatus and no problems voiding.  No BM since Sunday. Breastfeeding going well. Minimal pain with medicine. Lochia less than a period.   Objective: Vital signs in last 24 hours: Temp:  [98.1 F (36.7 C)-98.2 F (36.8 C)] 98.2 F (36.8 C) (07/26 0534) Pulse Rate:  [73-86] 81  (07/26 0534) Resp:  [17-18] 18  (07/26 0534) BP: (91-114)/(57-74) 114/72 mmHg (07/26 0534) SpO2:  [96 %] 96 % (07/25 0831)  Physical Exam:  General: alert, cooperative and no distress, tired Lochia: appropriate Uterine Fundus: firm, at umbilicus Incision: clean and dry. Mild erythema. No discharge or dehiscence. DVT Evaluation: No evidence of DVT seen on physical exam. Negative Homan's sign.   Basename 09/05/10 1040  HGB 8.5*  HCT 26.1*    Assessment/Plan: Status post Cesarean section. Doing well postoperatively.  Pain med: percocet and ibuprofen Discharge on iron and colace Constipation: dulcolax before discharge. Breastfeeding Contraception: BTL Discharge home with standard precautions and return to clinic in 4-6 weeks.  Amanda Roth 09/06/2010, 7:45 AM

## 2010-09-07 LAB — TYPE AND SCREEN
ABO/RH(D): O POS
Antibody Screen: NEGATIVE
Unit division: 0
Unit division: 0

## 2010-09-12 NOTE — Discharge Summary (Signed)
Agree with above note.  Jazzie Trampe A 09/12/2010 1:52 PM

## 2010-10-26 ENCOUNTER — Encounter (HOSPITAL_COMMUNITY): Payer: Self-pay | Admitting: *Deleted

## 2010-11-16 LAB — URINALYSIS, ROUTINE W REFLEX MICROSCOPIC
Glucose, UA: NEGATIVE
Protein, ur: NEGATIVE

## 2010-11-16 LAB — URINE MICROSCOPIC-ADD ON

## 2010-11-16 LAB — CBC
MCHC: 34.5
RBC: 4.31
WBC: 10.1

## 2010-11-16 LAB — BASIC METABOLIC PANEL
CO2: 25
Calcium: 8.9
Creatinine, Ser: 0.74
GFR calc Af Amer: >60

## 2011-01-15 ENCOUNTER — Other Ambulatory Visit: Payer: Self-pay | Admitting: Gastroenterology

## 2011-01-15 DIAGNOSIS — R1011 Right upper quadrant pain: Secondary | ICD-10-CM

## 2011-01-15 NOTE — Progress Notes (Signed)
Pt is scheduled for u/s 01/17/11@1 :15.  Pt is aware of appt.

## 2011-01-15 NOTE — Progress Notes (Signed)
RUQ pain for several months, worse with eating, associated with nausea. No reflux symptoms, dysphagia. Originally intermittent, now becoming more constant. Attempting to avoid fatty foods. Gallbladder in situ. Concerning for biliary component. Will proceed with [place] of abdomen.

## 2011-01-16 ENCOUNTER — Ambulatory Visit (HOSPITAL_COMMUNITY): Payer: 59

## 2011-01-22 ENCOUNTER — Ambulatory Visit (HOSPITAL_COMMUNITY): Payer: 59

## 2011-04-23 ENCOUNTER — Ambulatory Visit (HOSPITAL_COMMUNITY)
Admission: RE | Admit: 2011-04-23 | Discharge: 2011-04-23 | Disposition: A | Discharge status: Home / Self Care | Payer: 59 | Source: Ambulatory Visit | Attending: Gastroenterology | Admitting: Gastroenterology

## 2011-04-23 DIAGNOSIS — R1011 Right upper quadrant pain: Secondary | ICD-10-CM | POA: Insufficient documentation

## 2011-04-23 DIAGNOSIS — R932 Abnormal findings on diagnostic imaging of liver and biliary tract: Secondary | ICD-10-CM | POA: Insufficient documentation

## 2011-04-24 NOTE — Progress Notes (Signed)
Quick Note:  Discussed with pt. Current symptoms of bloating, intermittent indigestion, intermittent RUQ. At this point, biliary component still in differential, but I question underlying IBS, reflux.  Started on trial of Prilosec daily Avoid carbonated drinks, sugar substitutes such as aspartame High fiber diet Consider HIDA if continued RUQ pain.   ______

## 2011-07-11 ENCOUNTER — Other Ambulatory Visit: Payer: Self-pay | Admitting: Gastroenterology

## 2013-02-23 ENCOUNTER — Emergency Department (HOSPITAL_COMMUNITY)
Admission: EM | Admit: 2013-02-23 | Discharge: 2013-02-23 | Disposition: A | Discharge status: Home / Self Care | Payer: 59 | Attending: Emergency Medicine | Admitting: Emergency Medicine

## 2013-02-23 ENCOUNTER — Encounter (HOSPITAL_COMMUNITY): Payer: Self-pay | Admitting: Emergency Medicine

## 2013-02-23 DIAGNOSIS — M79609 Pain in unspecified limb: Secondary | ICD-10-CM | POA: Insufficient documentation

## 2013-02-23 DIAGNOSIS — F329 Major depressive disorder, single episode, unspecified: Secondary | ICD-10-CM | POA: Insufficient documentation

## 2013-02-23 DIAGNOSIS — M5430 Sciatica, unspecified side: Secondary | ICD-10-CM | POA: Insufficient documentation

## 2013-02-23 DIAGNOSIS — F3289 Other specified depressive episodes: Secondary | ICD-10-CM | POA: Insufficient documentation

## 2013-02-23 HISTORY — DX: Major depressive disorder, single episode, unspecified: F32.9

## 2013-02-23 HISTORY — DX: Depression, unspecified: F32.A

## 2013-02-23 MED ORDER — KETOROLAC TROMETHAMINE 60 MG/2ML IM SOLN
60.0000 mg | Freq: Once | INTRAMUSCULAR | Status: AC
  Administered 2013-02-23: 60 mg via INTRAMUSCULAR
  Filled 2013-02-23: qty 2

## 2013-02-23 MED ORDER — PREDNISONE 10 MG PO TABS: ORAL_TABLET | ORAL | Status: DC

## 2013-02-23 MED ORDER — CYCLOBENZAPRINE HCL 10 MG PO TABS: 10.0000 mg | ORAL_TABLET | Freq: Three times a day (TID) | ORAL | Status: DC | PRN

## 2013-02-23 MED ORDER — HYDROCODONE-ACETAMINOPHEN 5-325 MG PO TABS: ORAL_TABLET | ORAL | Status: DC

## 2013-02-23 MED ORDER — OXYCODONE-ACETAMINOPHEN 5-325 MG PO TABS
1.0000 | ORAL_TABLET | Freq: Once | ORAL | Status: AC
  Administered 2013-02-23: 1 via ORAL
  Filled 2013-02-23: qty 1

## 2013-02-23 MED ORDER — ONDANSETRON 8 MG PO TBDP
8.0000 mg | ORAL_TABLET | Freq: Once | ORAL | Status: AC
  Administered 2013-02-23: 8 mg via ORAL
  Filled 2013-02-23: qty 1

## 2013-02-23 NOTE — Discharge Instructions (Signed)
Back Pain, Adult Back pain is very common. The pain often gets better over time. The cause of back pain is usually not dangerous. Most people can learn to manage their back pain on their own.  HOME CARE   Stay active. Start with short walks on flat ground if you can. Try to walk farther each day.  Do not sit, drive, or stand in one place for more than 30 minutes. Do not stay in bed.  Do not avoid exercise or work. Activity can help your back heal faster.  Be careful when you bend or lift an object. Bend at your knees, keep the object close to you, and do not twist.  Sleep on a firm mattress. Lie on your side, and bend your knees. If you lie on your back, put a pillow under your knees.  Only take medicines as told by your doctor.  Put ice on the injured area.  Put ice in a plastic bag.  Place a towel between your skin and the bag.  Leave the ice on for 15-20 minutes, 03-04 times a day for the first 2 to 3 days. After that, you can switch between ice and heat packs.  Ask your doctor about back exercises or massage.  Avoid feeling anxious or stressed. Find good ways to deal with stress, such as exercise. GET HELP RIGHT AWAY IF:   Your pain does not go away with rest or medicine.  Your pain does not go away in 1 week.  You have new problems.  You do not feel well.  The pain spreads into your legs.  You cannot control when you poop (bowel movement) or pee (urinate).  Your arms or legs feel weak or lose feeling (numbness).  You feel sick to your stomach (nauseous) or throw up (vomit).  You have belly (abdominal) pain.  You feel like you may pass out (faint). MAKE SURE YOU:   Understand these instructions.  Will watch your condition.  Will get help right away if you are not doing well or get worse. Document Released: 07/17/2007 Document Revised: 04/22/2011 Document Reviewed: 06/18/2010 [facility] Patient Information 2014 [place].  Sciatica Sciatica is pain,  weakness, numbness, or tingling along your sciatic nerve. The nerve starts in the lower back and runs down the back of each leg. Nerve damage or certain conditions pinch or put pressure on the sciatic nerve. This causes the pain, weakness, and other discomforts of sciatica. HOME CARE   Only take medicine as told by your doctor.  Apply ice to the affected area for 20 minutes. Do this 3 4 times a day for the first 48 72 hours. Then try heat in the same way.  Exercise, stretch, or do your usual activities if these do not make your pain worse.  Go to physical therapy as told by your doctor.  Keep all doctor visits as told.  Do not wear high heels or shoes that are not supportive.  Get a firm mattress if your mattress is too soft to lessen pain and discomfort. GET HELP RIGHT AWAY IF:   You cannot control when you poop (bowel movement) or pee (urinate).  You have more weakness in your lower back, lower belly (pelvis), butt (buttocks), or legs.  You have redness or puffiness (swelling) of your back.  You have a burning feeling when you pee.  You have pain that gets worse when you lie down.  You have pain that wakes you from your sleep.  Your pain  is worse than past pain.  Your pain lasts longer than 4 weeks.  You are suddenly losing weight without reason. MAKE SURE YOU:   Understand these instructions.  Will watch this condition.  Will get help right away if you are not doing well or get worse. Document Released: 11/07/2007 Document Revised: 07/30/2011 Document Reviewed: 06/09/2011 [facility] Patient Information 2014 [place].

## 2013-02-23 NOTE — ED Provider Notes (Signed)
CSN: [id number]     Arrival date & time 02/23/13  1559 History   First MD Initiated Contact with Patient 02/23/13 1631     Chief Complaint  Patient presents with  . Back Pain   (Consider location/radiation/quality/duration/timing/severity/associated sxs/prior Treatment) Patient is a 38 y.o. female presenting with back pain. The history is provided by the patient and the spouse.  Back Pain Location:  Lumbar spine Quality:  Shooting and stabbing Radiates to:  R posterior upper leg, R thigh and R knee Pain severity:  Severe Pain is:  Same all the time Onset quality:  Sudden Duration:  2 days Timing:  Constant Progression:  Unchanged Chronicity:  New Context: lifting heavy objects and twisting   Context comment:  Patient states the pain began one day after she moved a matress.   Relieved by:  Nothing Worsened by:  Bending, ambulation, twisting and sitting Ineffective treatments:  Heating pad and NSAIDs Associated symptoms: leg pain   Associated symptoms: no abdominal pain, no abdominal swelling, no bladder incontinence, no bowel incontinence, no chest pain, no dysuria, no fever, no headaches, no numbness, no paresthesias, no pelvic pain, no perianal numbness, no tingling and no weakness     Past Medical History  Diagnosis Date  . No pertinent past medical history   . Depression    Past Surgical History  Procedure Laterality Date  . Dilation and curettage of uterus    . Appendectomy    . Closed reduction radial / ulnar shaft fracture    . Cesarean section     Family History  Problem Relation Age of Onset  . Hypertension Other   . Diabetes type II Other   . Coronary artery disease Other    History  Substance Use Topics  . Smoking status: Never Smoker   . Smokeless tobacco: Not on file  . Alcohol Use: No   OB History   Grav Para Term Preterm Abortions TAB SAB Ect Mult Living   4 3 2 1 1  0 0 0 0 3     Review of Systems  Constitutional: Negative for fever.    Respiratory: Negative for shortness of breath.   Cardiovascular: Negative for chest pain.  Gastrointestinal: Negative for vomiting, abdominal pain, constipation and bowel incontinence.  Genitourinary: Negative for bladder incontinence, dysuria, hematuria, flank pain, decreased urine volume, difficulty urinating and pelvic pain.       No perineal numbness or incontinence of urine or feces  Musculoskeletal: Positive for back pain. Negative for joint swelling.  Skin: Negative for rash.  Neurological: Negative for tingling, weakness, numbness, headaches and paresthesias.  All other systems reviewed and are negative.    Allergies  Review of patient's allergies indicates no known allergies.  Home Medications   Current Outpatient Rx  Name  Route  Sig  Dispense  Refill  . escitalopram (LEXAPRO) 10 MG tablet   Oral   Take 10 mg by mouth every evening.         Marland Kitchen ibuprofen (ADVIL,MOTRIN) 200 MG tablet   Oral   Take 200-600 mg by mouth every 6 (six) hours as needed.          BP 129/82  Pulse 99  Temp(Src) 98.2 F (36.8 C) (Oral)  Resp 16  Ht 5\' 2"  (1.575 m)  Wt 173 lb (78.472 kg)  BMI 31.63 kg/m2  SpO2 100%  LMP 02/01/2013 Physical Exam  Nursing note and vitals reviewed. Constitutional: She is oriented to person, place, and time. She appears well-developed  and well-nourished. No distress.  HENT:  Head: Normocephalic and atraumatic.  Neck: Normal range of motion. Neck supple.  Cardiovascular: Normal rate, regular rhythm, normal heart sounds and intact distal pulses.   No murmur heard. Pulmonary/Chest: Effort normal and breath sounds normal. No respiratory distress.  Abdominal: Soft. She exhibits no distension. There is no tenderness.  Musculoskeletal: She exhibits tenderness. She exhibits no edema.       Lumbar back: She exhibits tenderness and pain. She exhibits normal range of motion, no swelling, no deformity, no laceration and normal pulse.       Back:  ttp of the  right lumbar paraspinal muscles.  No spinal tenderness.  DP pulses are brisk and symmetrical.  Distal sensation intact.  Hip Flexors/Extensors are intact  Neurological: She is alert and oriented to person, place, and time. She has normal strength. No sensory deficit. She exhibits normal muscle tone. Coordination and gait normal.  Reflex Scores:      Patellar reflexes are 2+ on the right side and 2+ on the left side.      Achilles reflexes are 2+ on the right side and 2+ on the left side. Skin: Skin is warm and dry. No rash noted.    ED Course  Procedures (including critical care time) Labs Review Labs Reviewed - No data to display Imaging Review No results found.  EKG Interpretation   None       MDM    On recheck, patient is feeling better, pain improved.  Ambulates with a steady gait.  No focal neuro deficits.  No concerning symptoms for emergent neurological or infectious process.  Sx's appear c/w sciatica.  Pt agrees to symptomatic tx and close f/u with her PMD if not improving.  VSS.  She appears stable for d/c  Armandina Iman L. Trisha Mangleriplett, PA-C 02/24/13 1206

## 2013-02-23 NOTE — ED Notes (Signed)
Lower right sided back pain radiating down right leg x 2 days.  Denies injury.  Moved a mattress the day before pain began.

## 2013-03-03 NOTE — ED Provider Notes (Signed)
Medical screening examination/treatment/procedure(s) were performed by non-physician practitioner and as supervising physician I was immediately available for consultation/collaboration.  EKG Interpretation   None       Anitta Tenny, MD, FACEP   Necia Kamm L Treveon Bourcier, MD 03/03/13 1504 

## 2013-12-13 ENCOUNTER — Encounter (HOSPITAL_COMMUNITY): Payer: Self-pay | Admitting: Emergency Medicine

## 2014-05-24 ENCOUNTER — Other Ambulatory Visit (HOSPITAL_COMMUNITY): Payer: Self-pay | Admitting: Nurse Practitioner

## 2014-05-24 DIAGNOSIS — Z1231 Encounter for screening mammogram for malignant neoplasm of breast: Secondary | ICD-10-CM

## 2014-06-20 ENCOUNTER — Ambulatory Visit (HOSPITAL_COMMUNITY): Payer: Self-pay

## 2014-06-27 ENCOUNTER — Other Ambulatory Visit (HOSPITAL_COMMUNITY): Payer: Self-pay | Admitting: Nurse Practitioner

## 2014-06-27 DIAGNOSIS — R102 Pelvic and perineal pain: Secondary | ICD-10-CM

## 2014-06-28 ENCOUNTER — Other Ambulatory Visit (HOSPITAL_COMMUNITY): Payer: Self-pay | Admitting: Nurse Practitioner

## 2014-06-28 DIAGNOSIS — R102 Pelvic and perineal pain: Secondary | ICD-10-CM

## 2014-06-30 ENCOUNTER — Ambulatory Visit (HOSPITAL_COMMUNITY): Admission: RE | Admit: 2014-06-30 | Payer: Self-pay | Source: Ambulatory Visit

## 2014-07-04 ENCOUNTER — Other Ambulatory Visit (HOSPITAL_COMMUNITY): Payer: Self-pay

## 2014-07-25 ENCOUNTER — Ambulatory Visit (HOSPITAL_COMMUNITY)
Admission: RE | Admit: 2014-07-25 | Discharge: 2014-07-25 | Disposition: A | Discharge status: Home / Self Care | Payer: BC Managed Care – PPO | Source: Ambulatory Visit | Attending: Nurse Practitioner | Admitting: Nurse Practitioner

## 2014-07-25 DIAGNOSIS — Z1231 Encounter for screening mammogram for malignant neoplasm of breast: Secondary | ICD-10-CM | POA: Insufficient documentation

## 2015-03-13 ENCOUNTER — Other Ambulatory Visit (HOSPITAL_COMMUNITY)
Admission: RE | Admit: 2015-03-13 | Discharge: 2015-03-13 | Disposition: A | Discharge status: Home / Self Care | Payer: BC Managed Care – PPO | Source: Ambulatory Visit | Attending: Physical Medicine and Rehabilitation | Admitting: Physical Medicine and Rehabilitation

## 2015-03-13 DIAGNOSIS — E038 Other specified hypothyroidism: Secondary | ICD-10-CM | POA: Insufficient documentation

## 2015-03-13 DIAGNOSIS — E2839 Other primary ovarian failure: Secondary | ICD-10-CM | POA: Diagnosis not present

## 2015-03-13 DIAGNOSIS — E7800 Pure hypercholesterolemia, unspecified: Secondary | ICD-10-CM | POA: Diagnosis not present

## 2015-03-13 DIAGNOSIS — R5383 Other fatigue: Secondary | ICD-10-CM | POA: Insufficient documentation

## 2015-03-13 DIAGNOSIS — E2749 Other adrenocortical insufficiency: Secondary | ICD-10-CM | POA: Diagnosis not present

## 2015-03-13 DIAGNOSIS — R6882 Decreased libido: Secondary | ICD-10-CM | POA: Insufficient documentation

## 2015-03-13 LAB — CBC WITH DIFFERENTIAL/PLATELET
BASOS ABS: 0 10*3/uL (ref 0.0–0.1)
Basophils Relative: 0 %
EOS ABS: 0.1 10*3/uL (ref 0.0–0.7)
EOS PCT: 1 %
HCT: 40.4 % (ref 36.0–46.0)
Hemoglobin: 13.5 g/dL (ref 12.0–15.0)
Lymphocytes Relative: 28 %
Lymphs Abs: 2.5 10*3/uL (ref 0.7–4.0)
MCH: 28.8 pg (ref 26.0–34.0)
MCHC: 33.4 g/dL (ref 30.0–36.0)
MCV: 86.1 fL (ref 78.0–100.0)
MONO ABS: 0.6 10*3/uL (ref 0.1–1.0)
Monocytes Relative: 6 %
Neutro Abs: 5.9 10*3/uL (ref 1.7–7.7)
Neutrophils Relative %: 65 %
PLATELETS: 302 10*3/uL (ref 150–400)
RBC: 4.69 MIL/uL (ref 3.87–5.11)
RDW: 14 % (ref 11.5–15.5)
WBC: 9.1 10*3/uL (ref 4.0–10.5)

## 2015-03-13 LAB — COMPREHENSIVE METABOLIC PANEL
ALBUMIN: 4.2 g/dL (ref 3.5–5.0)
ALT: 23 U/L (ref 14–54)
ANION GAP: 6 (ref 5–15)
AST: 20 U/L (ref 15–41)
Alkaline Phosphatase: 69 U/L (ref 38–126)
BILIRUBIN TOTAL: 0.9 mg/dL (ref 0.3–1.2)
BUN: 9 mg/dL (ref 6–20)
CHLORIDE: 108 mmol/L (ref 101–111)
CO2: 27 mmol/L (ref 22–32)
Calcium: 8.9 mg/dL (ref 8.9–10.3)
Creatinine, Ser: 0.7 mg/dL (ref 0.44–1.00)
GFR calc Af Amer: >60 mL/min (ref >60)
GFR calc non Af Amer: >60 mL/min (ref >60)
GLUCOSE: 82 mg/dL (ref 65–99)
POTASSIUM: 3.3 mmol/L — AB (ref 3.5–5.1)
SODIUM: 141 mmol/L (ref 135–145)
TOTAL PROTEIN: 7.6 g/dL (ref 6.5–8.1)

## 2015-03-13 LAB — LIPID PANEL
CHOL/HDL RATIO: 4.2 ratio
Cholesterol: 208 mg/dL — ABNORMAL HIGH (ref 0–200)
HDL: 49 mg/dL (ref >40)
LDL Cholesterol: 137 mg/dL — ABNORMAL HIGH (ref 0–99)
Triglycerides: 109 mg/dL (ref <150)
VLDL: 22 mg/dL (ref 0–40)

## 2015-03-13 LAB — TSH: TSH: 1.012 u[IU]/mL (ref 0.350–4.500)

## 2015-03-14 LAB — FSH/LH
FSH: 4 m[IU]/mL
LH: 4.4 m[IU]/mL

## 2015-03-14 LAB — PROGESTERONE: PROGESTERONE: 1.8 ng/mL

## 2015-03-14 LAB — VITAMIN B12: VITAMIN B 12: 1918 pg/mL — AB (ref 180–914)

## 2015-03-14 LAB — T4: T4 TOTAL: 6.3 ug/dL (ref 4.5–12.0)

## 2015-03-14 LAB — CORTISOL: CORTISOL PLASMA: 6.8 ug/dL

## 2015-03-14 LAB — VITAMIN D 25 HYDROXY (VIT D DEFICIENCY, FRACTURES): VIT D 25 HYDROXY: 25.9 ng/mL — AB (ref 30.0–100.0)

## 2015-03-14 LAB — ESTRADIOL: Estradiol: 58.2 pg/mL

## 2015-03-14 LAB — T3, FREE: T3 FREE: 2.6 pg/mL (ref 2.0–4.4)

## 2015-03-14 LAB — TESTOSTERONE: TESTOSTERONE: 22 ng/dL (ref 8–48)

## 2015-03-14 LAB — FOLATE: Folate: 16.5 ng/mL (ref >5.9)

## 2015-03-15 LAB — THYROID PEROXIDASE ANTIBODY: Thyroperoxidase Ab SerPl-aCnc: 12 IU/mL (ref 0–34)

## 2015-03-18 LAB — DHEA: DHEA: 279 ng/dL (ref 102–1185)

## 2015-03-18 LAB — TESTOSTERONE, % FREE: TESTOSTERONE-% FREE: 1 % — AB (ref 0.2–0.7)

## 2015-04-12 ENCOUNTER — Other Ambulatory Visit (HOSPITAL_COMMUNITY): Payer: Self-pay | Admitting: Nurse Practitioner

## 2015-04-12 DIAGNOSIS — R531 Weakness: Secondary | ICD-10-CM

## 2015-04-12 DIAGNOSIS — H539 Unspecified visual disturbance: Secondary | ICD-10-CM

## 2015-04-12 DIAGNOSIS — R32 Unspecified urinary incontinence: Secondary | ICD-10-CM

## 2015-04-14 ENCOUNTER — Ambulatory Visit (HOSPITAL_COMMUNITY)
Admission: RE | Admit: 2015-04-14 | Discharge: 2015-04-14 | Disposition: A | Discharge status: Home / Self Care | Payer: BC Managed Care – PPO | Source: Ambulatory Visit | Attending: Nurse Practitioner | Admitting: Nurse Practitioner

## 2015-04-14 DIAGNOSIS — R32 Unspecified urinary incontinence: Secondary | ICD-10-CM | POA: Insufficient documentation

## 2015-04-14 DIAGNOSIS — R531 Weakness: Secondary | ICD-10-CM | POA: Insufficient documentation

## 2015-04-14 DIAGNOSIS — H539 Unspecified visual disturbance: Secondary | ICD-10-CM | POA: Diagnosis present

## 2015-04-14 MED ORDER — GADOBENATE DIMEGLUMINE 529 MG/ML IV SOLN
15.0000 mL | Freq: Once | INTRAVENOUS | Status: AC | PRN
  Administered 2015-04-14: 15 mL via INTRAVENOUS

## 2015-05-29 ENCOUNTER — Other Ambulatory Visit (HOSPITAL_COMMUNITY): Payer: Self-pay | Admitting: Nurse Practitioner

## 2015-05-29 DIAGNOSIS — N63 Unspecified lump in unspecified breast: Secondary | ICD-10-CM

## 2015-06-06 ENCOUNTER — Encounter (HOSPITAL_COMMUNITY): Payer: BC Managed Care – PPO

## 2015-06-06 ENCOUNTER — Ambulatory Visit (HOSPITAL_COMMUNITY)
Admission: RE | Admit: 2015-06-06 | Discharge: 2015-06-06 | Disposition: A | Discharge status: Home / Self Care | Payer: BC Managed Care – PPO | Source: Ambulatory Visit | Attending: Nurse Practitioner | Admitting: Nurse Practitioner

## 2015-06-06 DIAGNOSIS — N63 Unspecified lump in unspecified breast: Secondary | ICD-10-CM

## 2015-06-22 ENCOUNTER — Ambulatory Visit: Payer: BC Managed Care – PPO | Attending: Otolaryngology

## 2015-06-27 ENCOUNTER — Ambulatory Visit: Payer: BC Managed Care – PPO | Attending: Otolaryngology

## 2015-06-27 DIAGNOSIS — R0683 Snoring: Secondary | ICD-10-CM | POA: Diagnosis present

## 2015-06-27 DIAGNOSIS — G4710 Hypersomnia, unspecified: Secondary | ICD-10-CM | POA: Diagnosis present

## 2015-06-27 DIAGNOSIS — F5101 Primary insomnia: Secondary | ICD-10-CM | POA: Diagnosis not present

## 2016-05-13 ENCOUNTER — Other Ambulatory Visit (HOSPITAL_COMMUNITY): Payer: Self-pay | Admitting: Nurse Practitioner

## 2016-05-13 DIAGNOSIS — Z1231 Encounter for screening mammogram for malignant neoplasm of breast: Secondary | ICD-10-CM

## 2016-06-07 ENCOUNTER — Ambulatory Visit (HOSPITAL_COMMUNITY): Payer: BC Managed Care – PPO

## 2016-06-14 ENCOUNTER — Ambulatory Visit (HOSPITAL_COMMUNITY)
Admission: RE | Admit: 2016-06-14 | Discharge: 2016-06-14 | Disposition: A | Discharge status: Home / Self Care | Payer: BC Managed Care – PPO | Source: Ambulatory Visit | Attending: Nurse Practitioner | Admitting: Nurse Practitioner

## 2016-06-14 DIAGNOSIS — Z1231 Encounter for screening mammogram for malignant neoplasm of breast: Secondary | ICD-10-CM | POA: Insufficient documentation

## 2018-10-08 ENCOUNTER — Other Ambulatory Visit (HOSPITAL_COMMUNITY): Payer: Self-pay | Admitting: Nurse Practitioner

## 2018-10-08 DIAGNOSIS — Z1231 Encounter for screening mammogram for malignant neoplasm of breast: Secondary | ICD-10-CM

## 2019-06-30 ENCOUNTER — Ambulatory Visit (HOSPITAL_COMMUNITY): Payer: BC Managed Care – PPO

## 2019-12-23 ENCOUNTER — Other Ambulatory Visit (HOSPITAL_COMMUNITY): Payer: Self-pay | Admitting: Nurse Practitioner

## 2019-12-23 DIAGNOSIS — Z1231 Encounter for screening mammogram for malignant neoplasm of breast: Secondary | ICD-10-CM

## 2020-01-13 ENCOUNTER — Ambulatory Visit (HOSPITAL_COMMUNITY): Payer: Self-pay

## 2020-01-21 ENCOUNTER — Ambulatory Visit (HOSPITAL_COMMUNITY): Payer: Self-pay

## 2020-04-19 ENCOUNTER — Encounter: Payer: Self-pay | Admitting: Internal Medicine

## 2020-04-19 ENCOUNTER — Telehealth: Payer: Self-pay | Admitting: Internal Medicine

## 2020-04-26 ENCOUNTER — Ambulatory Visit (HOSPITAL_COMMUNITY): Payer: BC Managed Care – PPO

## 2020-05-24 ENCOUNTER — Encounter: Payer: Self-pay | Admitting: Internal Medicine

## 2020-05-24 ENCOUNTER — Ambulatory Visit: Payer: BC Managed Care – PPO | Admitting: Internal Medicine

## 2020-05-24 ENCOUNTER — Other Ambulatory Visit: Payer: Self-pay

## 2020-05-24 ENCOUNTER — Emergency Department (HOSPITAL_COMMUNITY): Payer: BC Managed Care – PPO

## 2020-05-24 ENCOUNTER — Encounter (HOSPITAL_COMMUNITY): Payer: Self-pay | Admitting: Emergency Medicine

## 2020-05-24 ENCOUNTER — Emergency Department (HOSPITAL_COMMUNITY)
Admission: EM | Admit: 2020-05-24 | Discharge: 2020-05-24 | Disposition: A | Discharge status: Home / Self Care | Payer: BC Managed Care – PPO | Attending: Emergency Medicine | Admitting: Emergency Medicine

## 2020-05-24 DIAGNOSIS — S0081XA Abrasion of other part of head, initial encounter: Secondary | ICD-10-CM | POA: Insufficient documentation

## 2020-05-24 DIAGNOSIS — S00511A Abrasion of lip, initial encounter: Secondary | ICD-10-CM | POA: Diagnosis not present

## 2020-05-24 DIAGNOSIS — W19XXXA Unspecified fall, initial encounter: Secondary | ICD-10-CM

## 2020-05-24 DIAGNOSIS — S00212A Abrasion of left eyelid and periocular area, initial encounter: Secondary | ICD-10-CM | POA: Diagnosis not present

## 2020-05-24 DIAGNOSIS — S0031XA Abrasion of nose, initial encounter: Secondary | ICD-10-CM | POA: Insufficient documentation

## 2020-05-24 DIAGNOSIS — S0993XA Unspecified injury of face, initial encounter: Secondary | ICD-10-CM | POA: Diagnosis present

## 2020-05-24 DIAGNOSIS — W228XXA Striking against or struck by other objects, initial encounter: Secondary | ICD-10-CM | POA: Diagnosis not present

## 2020-05-24 MED ORDER — ERYTHROMYCIN 5 MG/GM OP OINT
TOPICAL_OINTMENT | Freq: Once | OPHTHALMIC | Status: AC
  Administered 2020-05-24: 1 via OPHTHALMIC
  Filled 2020-05-24: qty 3.5

## 2020-05-24 MED ORDER — TETRACAINE HCL 0.5 % OP SOLN
2.0000 [drp] | Freq: Once | OPHTHALMIC | Status: AC
  Administered 2020-05-24: 2 [drp] via OPHTHALMIC
  Filled 2020-05-24: qty 4

## 2020-05-24 MED ORDER — FLUORESCEIN SODIUM 1 MG OP STRP
1.0000 | ORAL_STRIP | Freq: Once | OPHTHALMIC | Status: AC
  Administered 2020-05-24: 1 via OPHTHALMIC
  Filled 2020-05-24: qty 1

## 2020-05-24 MED ORDER — TRAMADOL HCL 50 MG PO TABS: 50.0000 mg | ORAL_TABLET | Freq: Four times a day (QID) | ORAL | 0 refills | Status: DC | PRN

## 2020-05-24 MED ORDER — BACITRACIN ZINC 500 UNIT/GM EX OINT: 1.0000 "application " | TOPICAL_OINTMENT | Freq: Two times a day (BID) | CUTANEOUS | 0 refills | Status: DC

## 2020-05-24 NOTE — Discharge Instructions (Addendum)
Clean the area gently with mild soap and water.  Apply the bacitracin twice daily as directed.  You may apply ice packs on and off to your face and warm compresses on and off to your eye.  You may take ibuprofen, 600 mg every 8 hours with food.  Follow-up with your primary care provider for recheck.  Return to the emergency department for any new or worsening symptoms.

## 2020-05-24 NOTE — ED Notes (Signed)
Warm compress to left eye.

## 2020-05-24 NOTE — ED Triage Notes (Signed)
Swelling, pain and bruising to LT eye after falling on Monday night and hitting a table

## 2020-05-24 NOTE — ED Provider Notes (Signed)
[facility] EMERGENCY DEPARTMENT Provider Note   CSN: [id number] Arrival date & time: 05/24/20  1121     History Chief Complaint  Patient presents with  . Eye Problem    Amanda Roth is a 45 y.o. female.  HPI      Amanda Roth is a 45 y.o. female who presents to the Emergency Department complaining of left facial pain and swelling for 2 days.  She states that she was drinking alcohol Monday night and fell against a table that tipped and struck the left side of her face.  She reports having abrasions along the crease of her nose that extends along her cheekbone and chin.  Fall was witnessed by her significant other.  She denies headache or LOC.  She describes having increasing swelling along her left periorbital region and the swelling is making it difficult for her to open her left eye.  She reports some blurred vision of her left eye and .  No foreign body sensation.  She has been applying wet compresses without relief.  She denies neck pain, headache, nausea or vomiting, dental injury and epistaxis.  Td up-to-date.    Past Medical History:  Diagnosis Date  . Depression   . No pertinent past medical history     Patient Active Problem List   Diagnosis Date Noted  . Abnormal antenatal AFP screen 04/25/2010  . Spontaneous abortion in second trimester 04/25/2010  . CLOSED UNSPECIFIED DISLOCATION OF ELBOW 01/26/2007    Past Surgical History:  Procedure Laterality Date  . APPENDECTOMY    . CESAREAN SECTION    . CLOSED REDUCTION RADIAL / ULNAR SHAFT FRACTURE    . DILATION AND CURETTAGE OF UTERUS       OB History    Gravida  4   Para  3   Term  2   Preterm  1   AB  1   Living  3     SAB  0   IAB  0   Ectopic  0   Multiple  0   Live Births  1           Family History  Problem Relation Age of Onset  . Hypertension Other   . Diabetes type II Other   . Coronary artery disease Other     Social History   Tobacco Use  . Smoking status:  Never Smoker  . Smokeless tobacco: Never Used  Substance Use Topics  . Alcohol use: Yes    Comment: occ  . Drug use: No    Home Medications Prior to Admission medications   Medication Sig Start Date End Date Taking? Authorizing Provider  cyclobenzaprine (FLEXERIL) 10 MG tablet Take 1 tablet (10 mg total) by mouth 3 (three) times daily as needed. 02/23/13   Ramiyah Mcclenahan, PA-C  escitalopram (LEXAPRO) 10 MG tablet Take 10 mg by mouth every evening.    [provider]  HYDROcodone-acetaminophen (NORCO/VICODIN) 5-325 MG per tablet Take one-two tabs po q 4-6 hrs prn pain 02/23/13   Angelina Neece, PA-C  ibuprofen (ADVIL,MOTRIN) 200 MG tablet Take 200-600 mg by mouth every 6 (six) hours as needed.    [provider]  predniSONE (DELTASONE) 10 MG tablet Take 6 tablets day one, 5 tablets day two, 4 tablets day three, 3 tablets day four, 2 tablets day five, then 1 tablet day six 02/23/13   Kydan Shanholtzer, PA-C    Allergies    Patient has no known allergies.  Review  of Systems   Review of Systems  Constitutional: Negative for chills, fatigue and fever.  HENT: Positive for facial swelling. Negative for dental problem, ear discharge, ear pain and trouble swallowing.        Left facial abrasion  Eyes: Positive for visual disturbance. Negative for pain and itching.       Tearing of the left eye.    Respiratory: Negative for cough.   Cardiovascular: Negative for chest pain.  Gastrointestinal: Negative for abdominal pain, nausea and vomiting.  Genitourinary: Negative for dysuria, flank pain and hematuria.  Musculoskeletal: Negative for arthralgias, myalgias, neck pain and neck stiffness.  Skin: Negative for rash.  Neurological: Negative for dizziness, syncope, weakness, numbness and headaches.  Hematological: Does not bruise/bleed easily.    Physical Exam Updated Vital Signs BP 133/80   Pulse 63   Temp 98.2 F (36.8 C) (Oral)   Resp 18   LMP 05/22/2020   SpO2 97%    Physical Exam Vitals and nursing note reviewed.  Constitutional:      Appearance: Normal appearance. She is not ill-appearing or toxic-appearing.  HENT:     Head:     Comments: Abrasion left face and periorbital region.  Moderate edema noted.  Pt able to spontaneously open left eye.  See attached photo    Mouth/Throat:     Mouth: Mucous membranes are moist.     Dentition: No dental tenderness.     Pharynx: Oropharynx is clear. Uvula midline. No pharyngeal swelling or uvula swelling.     Comments: Abrasion left upper and lower lip, mild edema of the upper lip.  Frenulum intact.  No dental injuries or malocclusion Eyes:     General: Lids are everted, no foreign bodies appreciated. Vision grossly intact. Gaze aligned appropriately.        Left eye: No foreign body or discharge.     Extraocular Movements: Extraocular movements intact.     Right eye: Normal extraocular motion.     Left eye: Normal extraocular motion.     Conjunctiva/sclera: Conjunctivae normal.     Left eye: Left conjunctiva is not injected. No hemorrhage.    Pupils: Pupils are equal, round, and reactive to light.     Left eye: No corneal abrasion or fluorescein uptake. Seidel exam negative.    Funduscopic exam:    Right eye: No papilledema.        Left eye: No papilledema.     Slit lamp exam:    Left eye: No hyphema or photophobia.     Comments: Small abrasion to the left lower lid  Neck:     Thyroid: No thyromegaly.     Meningeal: Kernig's sign absent.  Cardiovascular:     Rate and Rhythm: Normal rate and regular rhythm.     Pulses: Normal pulses.  Pulmonary:     Effort: Pulmonary effort is normal.     Breath sounds: Normal breath sounds. No wheezing.  Abdominal:     Palpations: Abdomen is soft.     Tenderness: There is no abdominal tenderness.  Musculoskeletal:        General: Normal range of motion.     Cervical back: Normal range of motion and neck supple. No tenderness.  Skin:    General: Skin is  warm.     Capillary Refill: Capillary refill takes less than 2 seconds.     Findings: No rash.  Neurological:     General: No focal deficit present.     Mental Status: She  is alert.     Sensory: Sensation is intact. No sensory deficit.     Motor: Motor function is intact. No weakness.     Coordination: Coordination is intact.     Gait: Gait is intact.     Comments: CN II-XII intact.  Speech clear.         ED Results / Procedures / Treatments   Labs (all labs ordered are listed, but only abnormal results are displayed) Labs Reviewed - No data to display  EKG None  Radiology CT Head Wo Contrast  Result Date: 05/24/2020 CLINICAL DATA:  Fall. Injury to the left eye, striking and against a table. Fall was on 05/22/2020. EXAM: EXAM CT HEAD WITHOUT CONTRAST CT MAXILLOFACIAL WITHOUT CONTRAST TECHNIQUE: Multidetector CT imaging of the head and maxillofacial structures were performed using the standard protocol without intravenous contrast. Multiplanar CT image reconstructions of the maxillofacial structures were also generated. COMPARISON:  Brain MRI, 04/14/2015. FINDINGS: CT HEAD FINDINGS Brain: No evidence of acute infarction, hemorrhage, hydrocephalus, extra-axial collection or mass lesion/mass effect. Vascular: No hyperdense vessel or unexpected calcification. Skull: Normal. Negative for fracture or focal lesion. Other: None. CT MAXILLOFACIAL FINDINGS Osseous: No fracture or mandibular dislocation. No destructive process. Orbits: Left preseptal periorbital soft tissue swelling. No abnormality of the left globe or postseptal orbit. Normal right globe and orbit. Sinuses: Clear.  Clear mastoid air cells and middle ear cavities. Soft tissues: Left periorbital preseptal soft tissue swelling extends to the left cheek. No soft tissue mass. No enlarged lymph nodes. IMPRESSION: HEAD CT 1. Normal. MAXILLOFACIAL CT 1. No fracture. 2. Left preseptal periorbital soft tissue swelling. No injury to the left  globe or postseptal orbit. Electronically Signed   By: Amie Portland M.D.   On: 05/24/2020 13:26   CT Maxillofacial Wo Contrast  Result Date: 05/24/2020 CLINICAL DATA:  Fall. Injury to the left eye, striking and against a table. Fall was on 05/22/2020. EXAM: EXAM CT HEAD WITHOUT CONTRAST CT MAXILLOFACIAL WITHOUT CONTRAST TECHNIQUE: Multidetector CT imaging of the head and maxillofacial structures were performed using the standard protocol without intravenous contrast. Multiplanar CT image reconstructions of the maxillofacial structures were also generated. COMPARISON:  Brain MRI, 04/14/2015. FINDINGS: CT HEAD FINDINGS Brain: No evidence of acute infarction, hemorrhage, hydrocephalus, extra-axial collection or mass lesion/mass effect. Vascular: No hyperdense vessel or unexpected calcification. Skull: Normal. Negative for fracture or focal lesion. Other: None. CT MAXILLOFACIAL FINDINGS Osseous: No fracture or mandibular dislocation. No destructive process. Orbits: Left preseptal periorbital soft tissue swelling. No abnormality of the left globe or postseptal orbit. Normal right globe and orbit. Sinuses: Clear.  Clear mastoid air cells and middle ear cavities. Soft tissues: Left periorbital preseptal soft tissue swelling extends to the left cheek. No soft tissue mass. No enlarged lymph nodes. IMPRESSION: HEAD CT 1. Normal. MAXILLOFACIAL CT 1. No fracture. 2. Left preseptal periorbital soft tissue swelling. No injury to the left globe or postseptal orbit. Electronically Signed   By: Amie Portland M.D.   On: 05/24/2020 13:26    Procedures Procedures   Medications Ordered in ED Medications  fluorescein ophthalmic strip 1 strip (has no administration in time range)  tetracaine (PONTOCAINE) 0.5 % ophthalmic solution 2 drop (has no administration in time range)    ED Course  I have reviewed the triage vital signs and the nursing notes.  Pertinent labs & imaging results that were available during my care of  the patient were reviewed by me and considered in my medical decision making (see  chart for details).     Visual Acuity  Right Eye Distance: 20/20 Left Eye Distance: 20/25 Bilateral Distance: 20/20  MDM Rules/Calculators/A&P                          Patient here with left facial pain and abrasions secondary to a fall that occurred Monday evening.  Admits to drinking alcohol and fell against a table.  No LOC, fall was witnessed by significant other.  No vomiting, headache, neck pain.  No focal neuro deficits on exam.  She does have localized tenderness and edema of the left face and periorbital region.  EOMs are intact.  Doubt entrapment.  Will obtain CT imaging  On exam, globe intact, no corneal abrasions appreciated.  No hyphema.  Vision intact.  CT head and maxillofacial without acute findings.  Visual acuity reassuring.  She is agreeable to wound care instructions as discussed, and close outpatient follow-up.  Return precautions were also discussed.  The patient appears reasonably screened and/or stabilized for discharge and I doubt any other medical condition or other [facility] requiring further screening, evaluation, or treatment in the ED at this time prior to discharge.    Final Clinical Impression(s) / ED Diagnoses Final diagnoses:  Abrasion of face, initial encounter  Fall, initial encounter    Rx / DC Orders ED Discharge Orders    None       Pauline Aus, Cordelia Poche 05/24/20 1849    Cathren Laine, MD 05/25/20 1710

## 2020-06-06 ENCOUNTER — Other Ambulatory Visit (HOSPITAL_COMMUNITY): Payer: Self-pay | Admitting: Nurse Practitioner

## 2020-06-06 DIAGNOSIS — Z1231 Encounter for screening mammogram for malignant neoplasm of breast: Secondary | ICD-10-CM

## 2020-06-09 ENCOUNTER — Ambulatory Visit (HOSPITAL_COMMUNITY): Admission: RE | Admit: 2020-06-09 | Payer: BC Managed Care – PPO | Source: Ambulatory Visit

## 2020-06-09 DIAGNOSIS — Z1231 Encounter for screening mammogram for malignant neoplasm of breast: Secondary | ICD-10-CM

## 2020-06-14 ENCOUNTER — Encounter (HOSPITAL_COMMUNITY): Payer: BC Managed Care – PPO

## 2020-11-01 ENCOUNTER — Encounter: Payer: Self-pay | Admitting: *Deleted

## 2020-11-13 ENCOUNTER — Other Ambulatory Visit (HOSPITAL_COMMUNITY): Payer: Self-pay | Admitting: Nurse Practitioner

## 2020-11-13 DIAGNOSIS — Z1231 Encounter for screening mammogram for malignant neoplasm of breast: Secondary | ICD-10-CM

## 2020-11-15 ENCOUNTER — Ambulatory Visit (HOSPITAL_COMMUNITY)
Admission: RE | Admit: 2020-11-15 | Discharge: 2020-11-15 | Disposition: A | Discharge status: Home / Self Care | Payer: BC Managed Care – PPO | Source: Ambulatory Visit | Attending: Nurse Practitioner | Admitting: Nurse Practitioner

## 2020-11-15 ENCOUNTER — Other Ambulatory Visit: Payer: Self-pay

## 2020-11-15 DIAGNOSIS — Z1231 Encounter for screening mammogram for malignant neoplasm of breast: Secondary | ICD-10-CM | POA: Diagnosis present

## 2020-12-07 ENCOUNTER — Ambulatory Visit: Payer: BC Managed Care – PPO

## 2020-12-14 ENCOUNTER — Ambulatory Visit: Payer: BC Managed Care – PPO

## 2022-04-03 ENCOUNTER — Other Ambulatory Visit (HOSPITAL_COMMUNITY): Payer: Self-pay | Admitting: Nurse Practitioner

## 2022-04-03 DIAGNOSIS — Z1231 Encounter for screening mammogram for malignant neoplasm of breast: Secondary | ICD-10-CM

## 2022-04-08 ENCOUNTER — Inpatient Hospital Stay (HOSPITAL_COMMUNITY): Admission: RE | Admit: 2022-04-08 | Payer: BC Managed Care – PPO | Source: Ambulatory Visit

## 2022-04-08 DIAGNOSIS — Z1231 Encounter for screening mammogram for malignant neoplasm of breast: Secondary | ICD-10-CM

## 2022-04-24 ENCOUNTER — Encounter: Payer: Self-pay | Admitting: *Deleted

## 2022-05-08 ENCOUNTER — Telehealth: Payer: Self-pay | Admitting: *Deleted

## 2022-05-08 DIAGNOSIS — Z1211 Encounter for screening for malignant neoplasm of colon: Secondary | ICD-10-CM

## 2022-05-08 NOTE — Telephone Encounter (Signed)
  Procedure: Colonoscopy  Height: 5'2 Weight: 197lbs       Have you had a colonoscopy before?  no  Do you have family history of colon cancer?  no  Do you have a family history of polyps? no  Previous colonoscopy with polyps removed? no  Do you have a history colorectal cancer?   no  Are you diabetic?  no  Do you have a prosthetic or mechanical heart valve? no  Do you have a pacemaker/defibrillator?   no  Have you had endocarditis/atrial fibrillation?  no  Do you use supplemental oxygen/CPAP?  no  Have you had joint replacement within the last 12 months?  no  Do you tend to be constipated or have to use laxatives?  no   Do you have history of alcohol use? If yes, how much and how often.  no  Do you have history or are you using drugs? If yes, what do are you  using?  no  Have you ever had a stroke/heart attack?  no  Have you ever had a heart or other vascular stent placed,?no  Do you take weight loss medication? no  female patients,: have you had a hysterectomy? no                              are you post menopausal?  no                              do you still have your menstrual cycle? yes    Date of last menstrual period? 05/22/22  Do you take any blood-thinning medications such as: (Plavix, aspirin, Coumadin, Aggrenox, Brilinta, Xarelto, Eliquis, Pradaxa, Savaysa or Effient)? no  If yes we need the name, milligram, dosage and who is prescribing doctor:               Current Outpatient Medications  Medication Sig Dispense Refill   ASHWAGANDHA PO Take by mouth. 2 tablets daily     Coenzyme Q10 (COQ-10) 100 MG CAPS Take by mouth daily.     hydrochlorothiazide (HYDRODIURIL) 25 MG tablet Take 25 mg by mouth daily.     INOSITOL PO Take by mouth daily.     Magnesium 300 MG CAPS Take by mouth daily.     pantoprazole (PROTONIX) 20 MG tablet Take 20 mg by mouth daily.     No current facility-administered medications for this visit.    No Known Allergies

## 2022-05-27 MED ORDER — PEG 3350-KCL-NA BICARB-NACL 420 G PO SOLR: 4000.0000 mL | Freq: Once | ORAL | 0 refills | Status: AC

## 2022-05-27 NOTE — Telephone Encounter (Signed)
Spoke with pt. Scheduled for 5/2 with Dr. Marletta Lor. Aware will send instructions. Rx for prep sent to pharmacy. Aware needs lab/ urine preg prior and when/where to fo.

## 2022-05-27 NOTE — Telephone Encounter (Signed)
ASA 2. Needs BMP and pregnancy screen prior.

## 2022-05-27 NOTE — Addendum Note (Signed)
Addended by: Armstead Peaks on: 05/27/2022 04:39 PM   Modules accepted: Orders

## 2022-05-29 ENCOUNTER — Encounter (INDEPENDENT_AMBULATORY_CARE_PROVIDER_SITE_OTHER): Payer: Self-pay | Admitting: *Deleted

## 2022-05-29 NOTE — Telephone Encounter (Signed)
Referral completed

## 2022-06-10 ENCOUNTER — Other Ambulatory Visit (HOSPITAL_COMMUNITY)
Admission: RE | Admit: 2022-06-10 | Discharge: 2022-06-10 | Disposition: A | Discharge status: Home / Self Care | Payer: BC Managed Care – PPO | Source: Ambulatory Visit | Attending: Internal Medicine | Admitting: Internal Medicine

## 2022-06-10 DIAGNOSIS — F32A Depression, unspecified: Secondary | ICD-10-CM | POA: Diagnosis not present

## 2022-06-10 DIAGNOSIS — Z1211 Encounter for screening for malignant neoplasm of colon: Secondary | ICD-10-CM | POA: Insufficient documentation

## 2022-06-10 DIAGNOSIS — K648 Other hemorrhoids: Secondary | ICD-10-CM | POA: Diagnosis not present

## 2022-06-10 DIAGNOSIS — K621 Rectal polyp: Secondary | ICD-10-CM | POA: Diagnosis not present

## 2022-06-10 LAB — BASIC METABOLIC PANEL
Anion gap: 7 (ref 5–15)
BUN: 10 mg/dL (ref 6–20)
CO2: 25 mmol/L (ref 22–32)
Calcium: 8 mg/dL — ABNORMAL LOW (ref 8.9–10.3)
Chloride: 106 mmol/L (ref 98–111)
Creatinine, Ser: 0.75 mg/dL (ref 0.44–1.00)
GFR, Estimated: >60 mL/min (ref >60)
Glucose, Bld: 122 mg/dL — ABNORMAL HIGH (ref 70–99)
Potassium: 3.5 mmol/L (ref 3.5–5.1)
Sodium: 138 mmol/L (ref 135–145)

## 2022-06-10 LAB — PREGNANCY, URINE: Preg Test, Ur: NEGATIVE

## 2022-06-13 ENCOUNTER — Ambulatory Visit (HOSPITAL_COMMUNITY): Payer: BC Managed Care – PPO | Admitting: Certified Registered Nurse Anesthetist

## 2022-06-13 ENCOUNTER — Other Ambulatory Visit: Payer: Self-pay

## 2022-06-13 ENCOUNTER — Encounter (HOSPITAL_COMMUNITY): Payer: Self-pay

## 2022-06-13 ENCOUNTER — Ambulatory Visit (HOSPITAL_COMMUNITY)
Admission: RE | Admit: 2022-06-13 | Discharge: 2022-06-13 | Disposition: A | Discharge status: Home / Self Care | Payer: BC Managed Care – PPO | Attending: Internal Medicine | Admitting: Internal Medicine

## 2022-06-13 ENCOUNTER — Encounter (HOSPITAL_COMMUNITY)
Admission: RE | Disposition: A | Discharge status: Home / Self Care | Payer: Self-pay | Source: Home / Self Care | Attending: Internal Medicine

## 2022-06-13 DIAGNOSIS — K648 Other hemorrhoids: Secondary | ICD-10-CM | POA: Diagnosis not present

## 2022-06-13 DIAGNOSIS — D128 Benign neoplasm of rectum: Secondary | ICD-10-CM | POA: Diagnosis not present

## 2022-06-13 DIAGNOSIS — Z1211 Encounter for screening for malignant neoplasm of colon: Secondary | ICD-10-CM

## 2022-06-13 DIAGNOSIS — K621 Rectal polyp: Secondary | ICD-10-CM | POA: Insufficient documentation

## 2022-06-13 DIAGNOSIS — F32A Depression, unspecified: Secondary | ICD-10-CM | POA: Insufficient documentation

## 2022-06-13 HISTORY — PX: POLYPECTOMY: SHX5525

## 2022-06-13 HISTORY — PX: COLONOSCOPY WITH PROPOFOL: SHX5780

## 2022-06-13 SURGERY — COLONOSCOPY WITH PROPOFOL
Anesthesia: General

## 2022-06-13 MED ORDER — LACTATED RINGERS IV SOLN: INTRAVENOUS | Status: DC

## 2022-06-13 MED ORDER — LIDOCAINE HCL (CARDIAC) PF 100 MG/5ML IV SOSY
PREFILLED_SYRINGE | INTRAVENOUS | Status: DC | PRN
  Administered 2022-06-13: 50 mg via INTRAVENOUS

## 2022-06-13 MED ORDER — PROPOFOL 500 MG/50ML IV EMUL
INTRAVENOUS | Status: DC | PRN
  Administered 2022-06-13: 150 ug/kg/min via INTRAVENOUS

## 2022-06-13 MED ORDER — PROPOFOL 10 MG/ML IV BOLUS
INTRAVENOUS | Status: DC | PRN
  Administered 2022-06-13: 100 mg via INTRAVENOUS

## 2022-06-13 NOTE — Discharge Instructions (Addendum)
  Colonoscopy Discharge Instructions  Read the instructions outlined below and refer to this sheet in the next few weeks. These discharge instructions provide you with general information on caring for yourself after you leave the hospital. Your doctor may also give you specific instructions. While your treatment has been planned according to the most current medical practices available, unavoidable complications occasionally occur.   ACTIVITY You may resume your regular activity, but move at a slower pace for the next 24 hours.  Take frequent rest periods for the next 24 hours.  Walking will help get rid of the air and reduce the bloated feeling in your belly (abdomen).  No driving for 24 hours (because of the medicine (anesthesia) used during the test).   Do not sign any important legal documents or operate any machinery for 24 hours (because of the anesthesia used during the test).  NUTRITION Drink plenty of fluids.  You may resume your normal diet as instructed by your doctor.  Begin with a light meal and progress to your normal diet. Heavy or fried foods are harder to digest and may make you feel sick to your stomach (nauseated).  Avoid alcoholic beverages for 24 hours or as instructed.  MEDICATIONS You may resume your normal medications unless your doctor tells you otherwise.  WHAT YOU CAN EXPECT TODAY Some feelings of bloating in the abdomen.  Passage of more gas than usual.  Spotting of blood in your stool or on the toilet paper.  IF YOU HAD POLYPS REMOVED DURING THE COLONOSCOPY: No aspirin products for 7 days or as instructed.  No alcohol for 7 days or as instructed.  Eat a soft diet for the next 24 hours.  FINDING OUT THE RESULTS OF YOUR TEST Not all test results are available during your visit. If your test results are not back during the visit, make an appointment with your caregiver to find out the results. Do not assume everything is normal if you have not heard from your  caregiver or the medical facility. It is important for you to follow up on all of your test results.  SEEK IMMEDIATE MEDICAL ATTENTION IF: You have more than a spotting of blood in your stool.  Your belly is swollen (abdominal distention).  You are nauseated or vomiting.  You have a temperature over 101.  You have abdominal pain or discomfort that is severe or gets worse throughout the day.   Your colonoscopy revealed 1 polyp(s) which I removed successfully. Await pathology results, my office will contact you. I recommend repeating colonoscopy in 7-10 years for surveillance purposes.   Otherwise follow up with GI as needed.   I hope you have a great birthday!  Hennie Duos. Marletta Lor, D.O. Gastroenterology and Hepatology [facility]

## 2022-06-13 NOTE — Transfer of Care (Signed)
Immediate Anesthesia Transfer of Care Note  Patient: Amanda Roth  Procedure(s) Performed: COLONOSCOPY WITH PROPOFOL POLYPECTOMY  Patient Location: Endoscopy Unit  Anesthesia Type:General  Level of Consciousness: awake  Airway & Oxygen Therapy: Patient Spontanous Breathing  Post-op Assessment: Report given to RN and Post -op Vital signs reviewed and stable  Post vital signs: Reviewed and stable  Last Vitals:  Vitals Value Taken Time  BP 119/72   Temp 36.8 C 06/13/22 1111  Pulse 75 06/13/22 1111  Resp 20 06/13/22 1111  SpO2 99 % 06/13/22 1111    Last Pain:  Vitals:   06/13/22 1111  TempSrc: Oral  PainSc: 0-No pain      Patients Stated Pain Goal: 9 (06/13/22 1031)  Complications: No notable events documented.

## 2022-06-13 NOTE — H&P (Signed)
Primary Care Physician:  Erasmo Downer, NP Primary Gastroenterologist:  Dr. Marletta Lor  Pre-Procedure History & Physical: HPI:  Amanda Roth is a 47 y.o. female is here for first ever colonoscopy for colon cancer screening purposes.  Patient denies any family history of colorectal cancer.  No melena or hematochezia.  No abdominal pain or unintentional weight loss.  No change in bowel habits.  Overall feels well from a GI standpoint.  Past Medical History:  Diagnosis Date   Depression    No pertinent past medical history     Past Surgical History:  Procedure Laterality Date   APPENDECTOMY     CESAREAN SECTION     CLOSED REDUCTION RADIAL / ULNAR SHAFT FRACTURE     DILATION AND CURETTAGE OF UTERUS      Prior to Admission medications   Medication Sig Start Date End Date Taking? Authorizing Provider  ASHWAGANDHA PO Take by mouth. 2 tablets daily   Yes [provider]  Coenzyme Q10 (COQ-10) 100 MG CAPS Take by mouth daily.   Yes [provider]  hydrochlorothiazide (HYDRODIURIL) 25 MG tablet Take 25 mg by mouth daily.   Yes [provider]  INOSITOL PO Take by mouth daily.   Yes [provider]  Magnesium 300 MG CAPS Take by mouth daily.   Yes [provider]  pantoprazole (PROTONIX) 20 MG tablet Take 20 mg by mouth daily.   Yes [provider]    Allergies as of 05/27/2022   (No Known Allergies)    Family History  Problem Relation Age of Onset   Hypertension Other    Diabetes type II Other    Coronary artery disease Other     Social History   Socioeconomic History   Marital status: Married    Spouse name: Not on file   Number of children: Not on file   Years of education: Not on file   Highest education level: Not on file  Occupational History   Occupation: paramedic    Employer: [employer]   Occupation: charge entry    Comment: [employer]  Tobacco Use   Smoking status: Never    Smokeless tobacco: Never  Substance and Sexual Activity   Alcohol use: Yes    Comment: occ   Drug use: No   Sexual activity: Yes    Partners: Male  Other Topics Concern   Not on file  Social History Narrative   Not on file   Social Determinants of Health   Financial Resource Strain: Not on file  Food Insecurity: Not on file  Transportation Needs: Not on file  Physical Activity: Not on file  Stress: Not on file  Social Connections: Not on file  Intimate Partner Violence: Not on file    Review of Systems: See HPI, otherwise negative ROS  Physical Exam: Vital signs in last 24 hours: Temp:  [98.4 F (36.9 C)] 98.4 F (36.9 C) (05/02 1031) Pulse Rate:  [76] 76 (05/02 1031) Resp:  [17] 17 (05/02 1031) BP: (116)/(82) 116/82 (05/02 1031) SpO2:  [95 %] 95 % (05/02 1031) Weight:  [88.5 kg] 88.5 kg (05/02 1031)   General:   Alert,  Well-developed, well-nourished, pleasant and cooperative in NAD Head:  Normocephalic and atraumatic. Eyes:  Sclera clear, no icterus.   Conjunctiva pink. Ears:  Normal auditory acuity. Nose:  No deformity, discharge,  or lesions. Msk:  Symmetrical without gross deformities. Normal posture. Extremities:  Without clubbing or edema. Neurologic:  Alert and  oriented x4;  grossly normal neurologically. Skin:  Intact without significant lesions or rashes. Psych:  Alert and cooperative. Normal mood and affect.  Impression/Plan: Amanda Roth is here for a colonoscopy to be performed for colon cancer screening purposes.  The risks of the procedure including infection, bleed, or perforation as well as benefits, limitations, alternatives and imponderables have been reviewed with the patient. Questions have been answered. All parties agreeable.

## 2022-06-13 NOTE — Anesthesia Preprocedure Evaluation (Signed)
Anesthesia Evaluation  Patient identified by MRN, date of birth, ID band Patient awake    Reviewed: Allergy & Precautions, H&P , NPO status , Patient's Chart, lab work & pertinent test results, reviewed documented beta blocker date and time   Airway Mallampati: II  TM Distance: >3 FB Neck ROM: full    Dental no notable dental hx.    Pulmonary neg pulmonary ROS   Pulmonary exam normal breath sounds clear to auscultation       Cardiovascular Exercise Tolerance: Good negative cardio ROS  Rhythm:regular Rate:Normal     Neuro/Psych  PSYCHIATRIC DISORDERS  Depression    negative neurological ROS  negative psych ROS   GI/Hepatic negative GI ROS, Neg liver ROS,,,  Endo/Other  negative endocrine ROS    Renal/GU negative Renal ROS  negative genitourinary   Musculoskeletal   Abdominal   Peds  Hematology negative hematology ROS (+)   Anesthesia Other Findings   Reproductive/Obstetrics negative OB ROS                             Anesthesia Physical Anesthesia Plan  ASA: 1  Anesthesia Plan: General   Post-op Pain Management:    Induction:   PONV Risk Score and Plan: Propofol infusion  Airway Management Planned:   Additional Equipment:   Intra-op Plan:   Post-operative Plan:   Informed Consent: I have reviewed the patients History and Physical, chart, labs and discussed the procedure including the risks, benefits and alternatives for the proposed anesthesia with the patient or authorized representative who has indicated his/her understanding and acceptance.     Dental Advisory Given  Plan Discussed with: CRNA  Anesthesia Plan Comments:        Anesthesia Quick Evaluation

## 2022-06-13 NOTE — Op Note (Signed)
[facility] Patient Name: Amanda Roth Procedure Date: 06/13/2022 10:46 AM MRN: [id number] Date of Birth: [birth year] Attending MD: Hennie Duos. Marletta Lor , [place], [id number] CSN: [id number] Age: 47 Admit Type: Outpatient Procedure:                Colonoscopy Indications:              Screening for colorectal malignant neoplasm Providers:                Hennie Duos. Marletta Lor, DO, Jannett Celestine, RN, Dyann Ruddle Referring MD:              Medicines:                See the Anesthesia note for documentation of the                            administered medications Complications:            No immediate complications. Estimated Blood Loss:     Estimated blood loss was minimal. Procedure:                Pre-Anesthesia Assessment:                           - The anesthesia plan was to use monitored                            anesthesia care (MAC).                           After obtaining informed consent, the colonoscope                            was passed under direct vision. Throughout the                            procedure, the patient's blood pressure, pulse, and                            oxygen saturations were monitored continuously. The                            [id number]-HQ190L ([id number]) scope was introduced through                            the anus and advanced to the the cecum, identified                            by appendiceal orifice and ileocecal valve. The                            colonoscopy was performed without difficulty. The                            patient tolerated the procedure well. The quality  of the bowel preparation was evaluated using the                            BBPS [facility] Bowel Preparation Scale) with scores                            of: Right Colon = 3, Transverse Colon = 3 and Left                            Colon = 3 (entire mucosa seen well with no residual                            staining, small fragments of stool or opaque                             liquid). The total BBPS score equals 9. Scope In: 11:00:08 AM Scope Out: 11:08:50 AM Scope Withdrawal Time: 0 hours 6 minutes 57 seconds  Total Procedure Duration: 0 hours 8 minutes 42 seconds  Findings:      Non-bleeding internal hemorrhoids were found during endoscopy.      A 5 mm polyp was found in the rectum. The polyp was sessile. The polyp       was removed with a cold snare. Resection and retrieval were complete.      The exam was otherwise without abnormality on direct and retroflexion       views. Impression:               - Non-bleeding internal hemorrhoids.                           - One 5 mm polyp in the rectum, removed with a cold                            snare. Resected and retrieved.                           - The examination was otherwise normal on direct                            and retroflexion views. Moderate Sedation:      Per Anesthesia Care Recommendation:           - Patient has a contact number available for                            emergencies. The signs and symptoms of potential                            delayed complications were discussed with the                            patient. Return to normal activities tomorrow.                            Written discharge instructions were provided to  the                            patient.                           - Resume previous diet.                           - Continue present medications.                           - Await pathology results.                           - Repeat colonoscopy in 7-10 years for surveillance.                           - Return to GI clinic PRN. Procedure Code(s):        --- Professional ---                           [phone], Colonoscopy, flexible; with removal of                            tumor(s), polyp(s), or other lesion(s) by snare                            technique Diagnosis Code(s):        --- Professional ---                            Z12.11, Encounter for screening for malignant                            neoplasm of colon                           D12.8, Benign neoplasm of rectum                           K64.8, Other hemorrhoids CPT copyright 2022 American [employer] Association. All rights reserved. The codes documented in this report are preliminary and upon coder review may  be revised to meet current compliance requirements. Hennie Duos. Marletta Lor, DO Hennie Duos. Marletta Lor, DO 06/13/2022 11:11:45 AM This report has been signed electronically. Number of Addenda: 0

## 2022-06-14 LAB — SURGICAL PATHOLOGY

## 2022-06-16 NOTE — Anesthesia Postprocedure Evaluation (Signed)
Anesthesia Post Note  Patient: Amanda Roth  Procedure(s) Performed: COLONOSCOPY WITH PROPOFOL POLYPECTOMY  Patient location during evaluation: Phase II Anesthesia Type: General Level of consciousness: awake Pain management: pain level controlled Vital Signs Assessment: post-procedure vital signs reviewed and stable Respiratory status: spontaneous breathing and respiratory function stable Cardiovascular status: blood pressure returned to baseline and stable Postop Assessment: no headache and no apparent nausea or vomiting Anesthetic complications: no Comments: Late entry   No notable events documented.   Last Vitals:  Vitals:   06/13/22 1031 06/13/22 1111  BP: 116/82 119/72  Pulse: 76 75  Resp: 17 20  Temp: 36.9 C 36.8 C  SpO2: 95% 99%    Last Pain:  Vitals:   06/13/22 1111  TempSrc: Oral  PainSc: 0-No pain                 Windell Norfolk

## 2022-06-20 ENCOUNTER — Encounter (HOSPITAL_COMMUNITY): Payer: Self-pay | Admitting: Internal Medicine

## 2022-06-22 IMAGING — MG MM DIGITAL SCREENING BILAT W/ TOMO AND CAD
6 of 12 series · 6 of 36 positions shown · non-contrast
Comparison: Previous exam(s).

CLINICAL DATA: Screening.

EXAM:
DIGITAL SCREENING BILATERAL MAMMOGRAM WITH TOMOSYNTHESIS AND CAD
TECHNIQUE: Bilateral screening digital craniocaudal and mediolateral oblique
mammograms were obtained. Bilateral screening digital breast
tomosynthesis was performed. The images were evaluated with
computer-aided detection.

[R MLO synth-2D (1 of 2)]
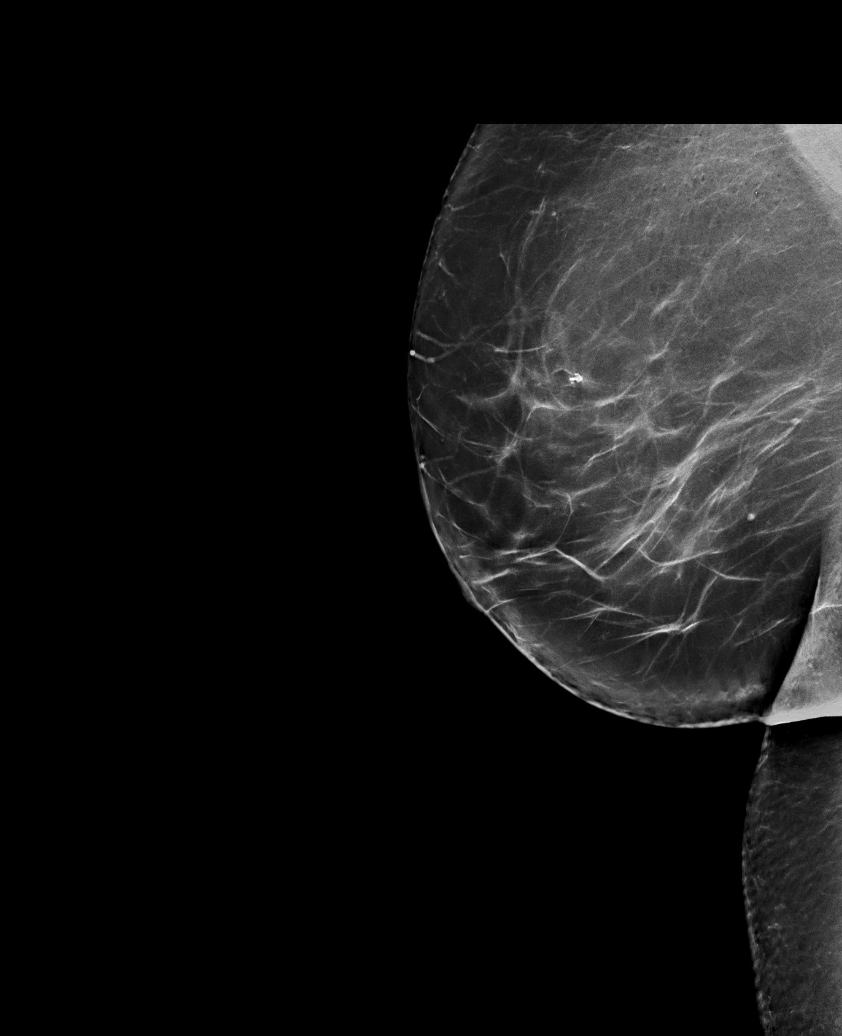

[L MLO synth-2D (1 of 2)]
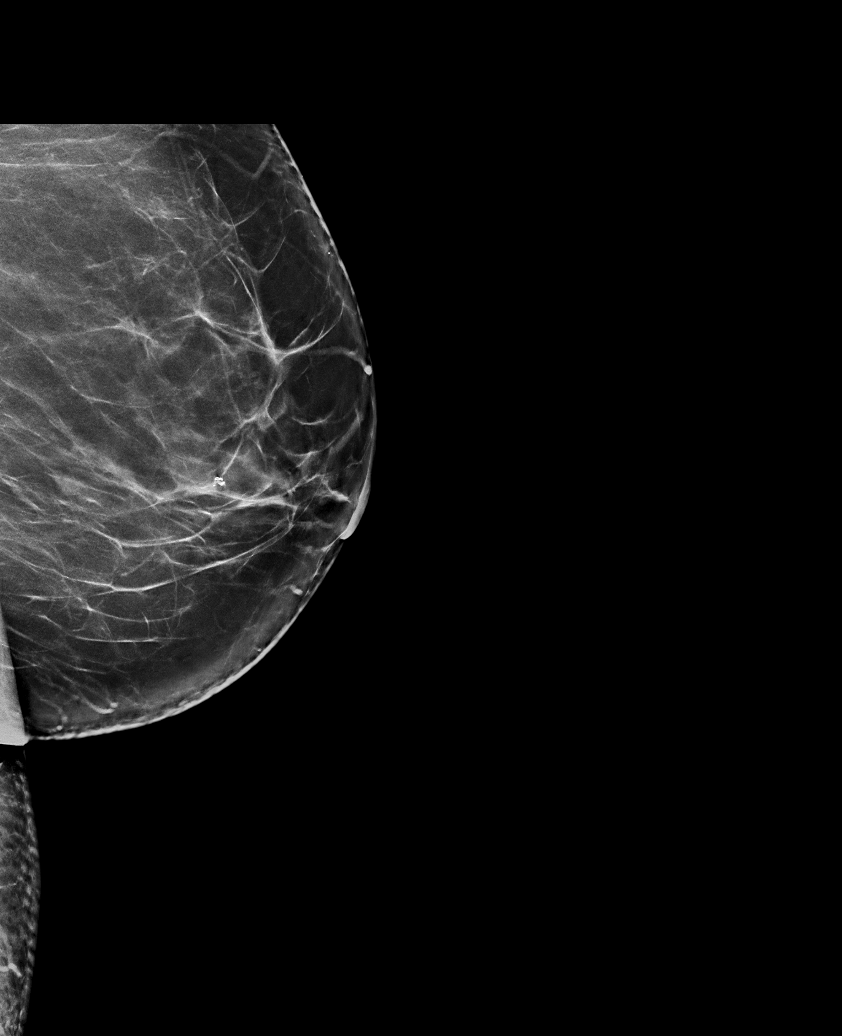

[R MLO synth-2D (2 of 2)]
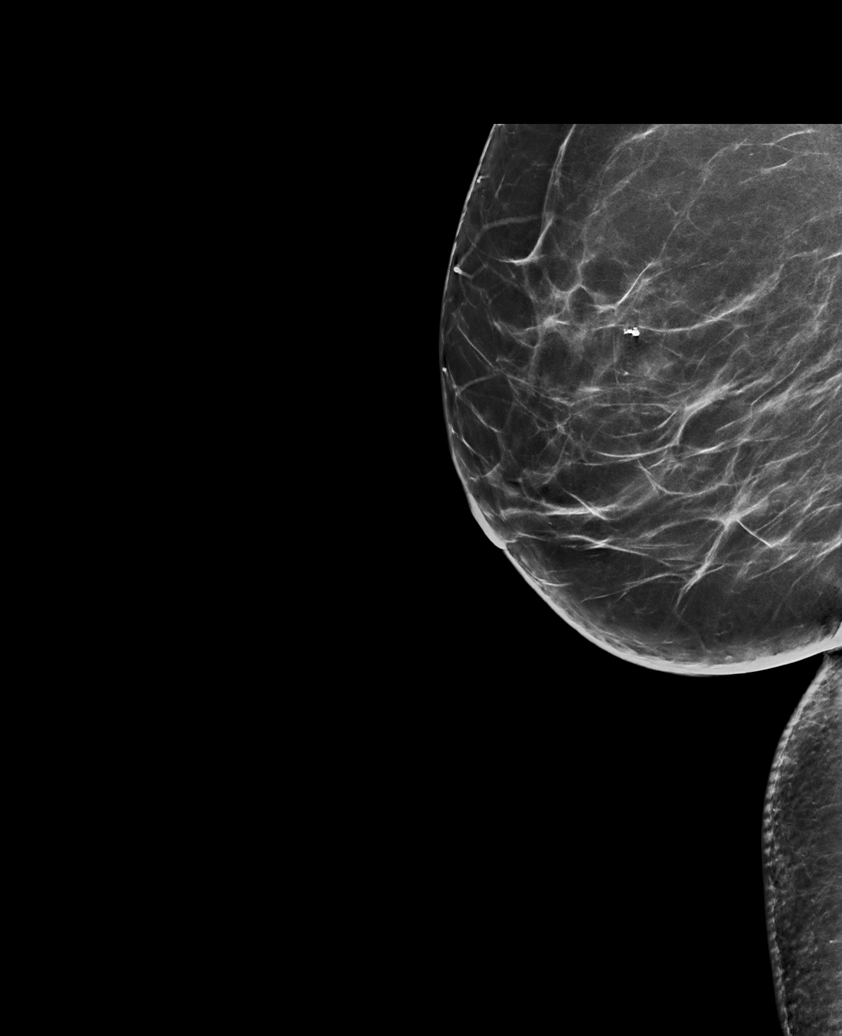

[R CC synth-2D]
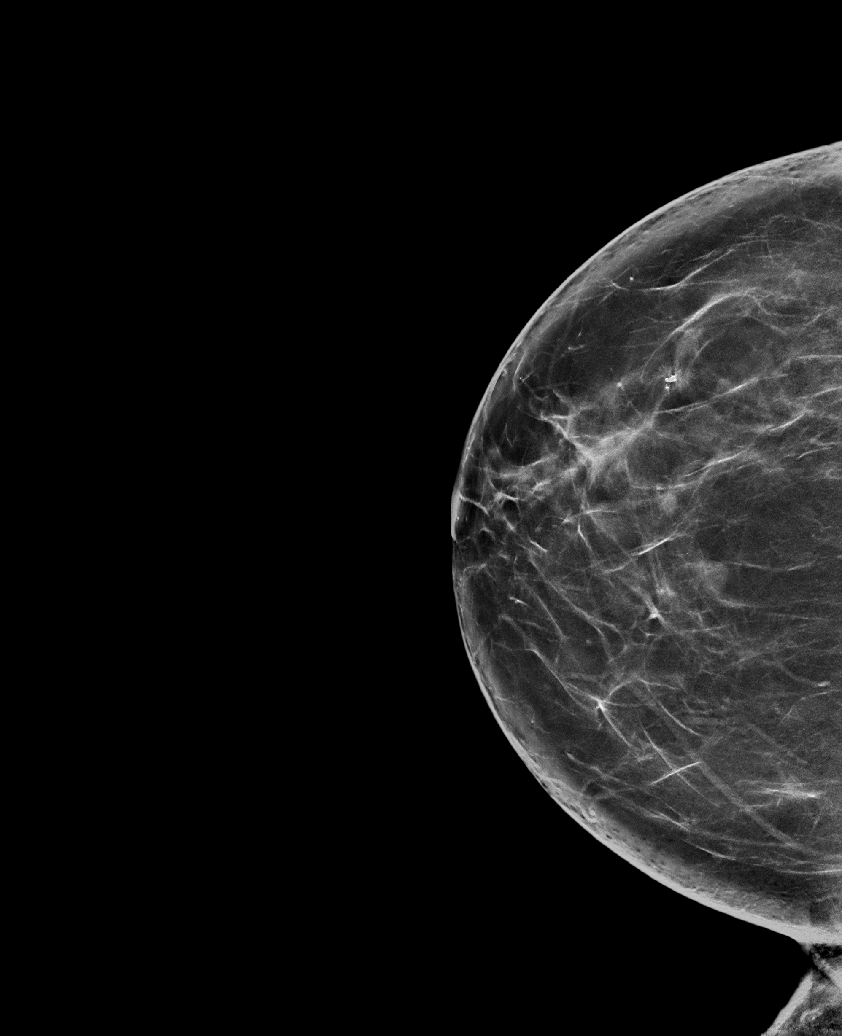

[L CC synth-2D]
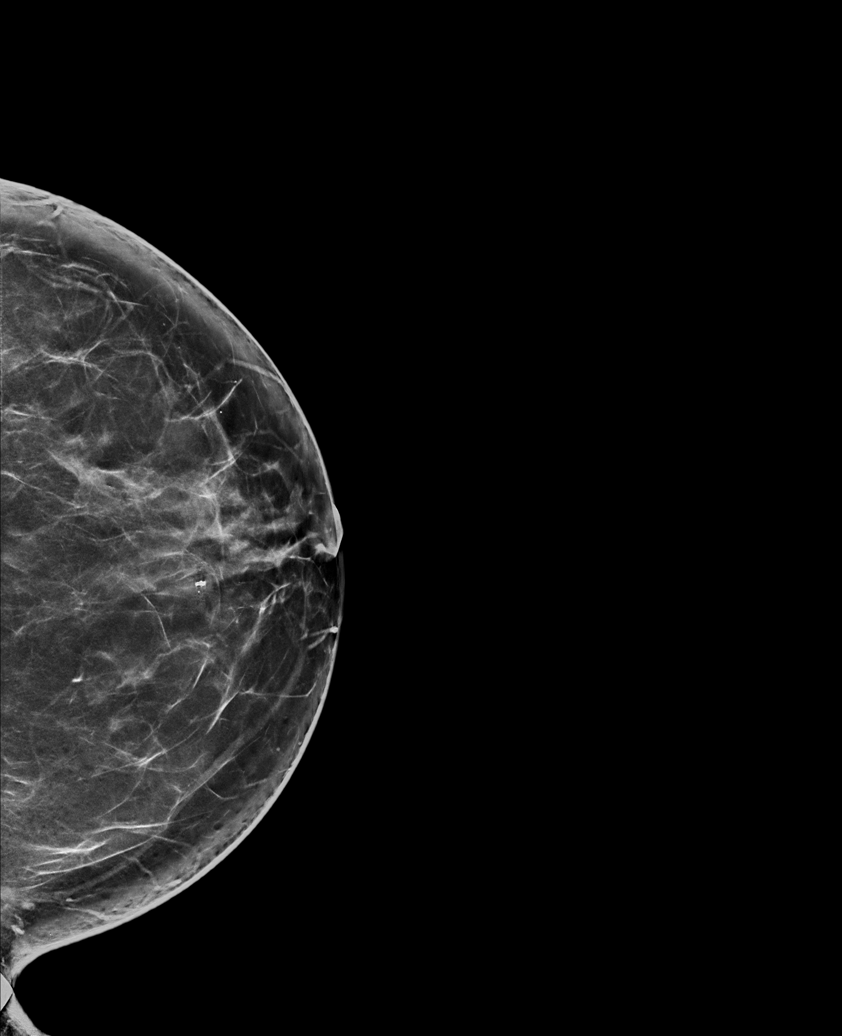

[L MLO synth-2D (2 of 2)]
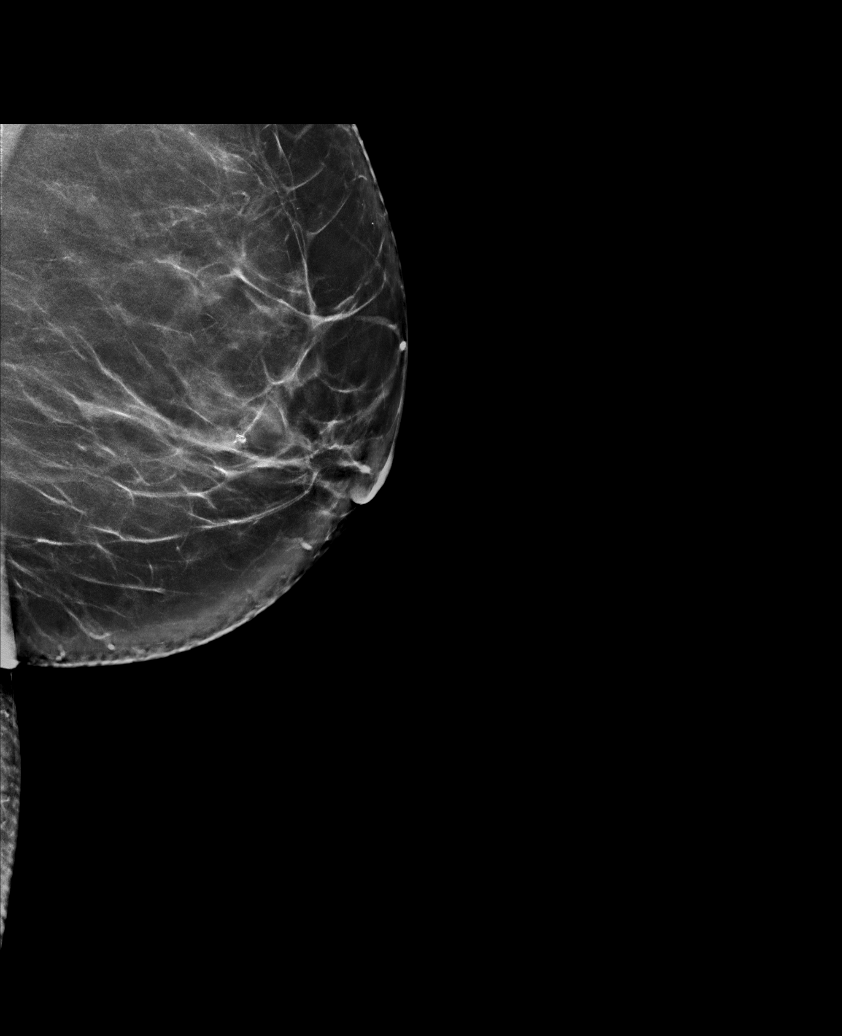

[6 of 36 positions shown; findings below may reference images not displayed]

ACR Breast Density Category b: There are scattered areas of
fibroglandular density.
FINDINGS: There are no findings suspicious for malignancy.
IMPRESSION: No mammographic evidence of malignancy. A result letter of this
screening mammogram will be mailed directly to the patient.

RECOMMENDATION:
Screening mammogram in one year. (Code:51-O-LD2)

BI-RADS CATEGORY  1: Negative.

## 2023-04-03 ENCOUNTER — Emergency Department (HOSPITAL_COMMUNITY)
Admission: EM | Admit: 2023-04-03 | Discharge: 2023-04-03 | Disposition: A | Discharge status: Home / Self Care | Payer: BC Managed Care – PPO | Attending: Emergency Medicine | Admitting: Emergency Medicine

## 2023-04-03 ENCOUNTER — Emergency Department (HOSPITAL_COMMUNITY): Payer: BC Managed Care – PPO

## 2023-04-03 ENCOUNTER — Encounter (HOSPITAL_COMMUNITY): Payer: Self-pay | Admitting: Emergency Medicine

## 2023-04-03 ENCOUNTER — Other Ambulatory Visit: Payer: Self-pay

## 2023-04-03 DIAGNOSIS — M899 Disorder of bone, unspecified: Secondary | ICD-10-CM | POA: Insufficient documentation

## 2023-04-03 DIAGNOSIS — I10 Essential (primary) hypertension: Secondary | ICD-10-CM | POA: Insufficient documentation

## 2023-04-03 DIAGNOSIS — Z79899 Other long term (current) drug therapy: Secondary | ICD-10-CM | POA: Diagnosis not present

## 2023-04-03 DIAGNOSIS — R079 Chest pain, unspecified: Secondary | ICD-10-CM | POA: Insufficient documentation

## 2023-04-03 DIAGNOSIS — K760 Fatty (change of) liver, not elsewhere classified: Secondary | ICD-10-CM | POA: Diagnosis not present

## 2023-04-03 LAB — CBC WITH DIFFERENTIAL/PLATELET
Abs Immature Granulocytes: 0.03 10*3/uL (ref 0.00–0.07)
Basophils Absolute: 0 10*3/uL (ref 0.0–0.1)
Basophils Relative: 0 %
Eosinophils Absolute: 0 10*3/uL (ref 0.0–0.5)
Eosinophils Relative: 0 %
HCT: 42.5 % (ref 36.0–46.0)
Hemoglobin: 13.6 g/dL (ref 12.0–15.0)
Immature Granulocytes: 0 %
Lymphocytes Relative: 18 %
Lymphs Abs: 1.4 10*3/uL (ref 0.7–4.0)
MCH: 27.6 pg (ref 26.0–34.0)
MCHC: 32 g/dL (ref 30.0–36.0)
MCV: 86.4 fL (ref 80.0–100.0)
Monocytes Absolute: 0.3 10*3/uL (ref 0.1–1.0)
Monocytes Relative: 4 %
Neutro Abs: 5.8 10*3/uL (ref 1.7–7.7)
Neutrophils Relative %: 78 %
Platelets: 269 10*3/uL (ref 150–400)
RBC: 4.92 MIL/uL (ref 3.87–5.11)
RDW: 16.1 % — ABNORMAL HIGH (ref 11.5–15.5)
WBC: 7.6 10*3/uL (ref 4.0–10.5)
nRBC: 0 % (ref 0.0–0.2)

## 2023-04-03 LAB — COMPREHENSIVE METABOLIC PANEL
ALT: 18 U/L (ref 0–44)
AST: 18 U/L (ref 15–41)
Albumin: 3.9 g/dL (ref 3.5–5.0)
Alkaline Phosphatase: 63 U/L (ref 38–126)
Anion gap: 11 (ref 5–15)
BUN: 11 mg/dL (ref 6–20)
CO2: 22 mmol/L (ref 22–32)
Calcium: 8.6 mg/dL — ABNORMAL LOW (ref 8.9–10.3)
Chloride: 107 mmol/L (ref 98–111)
Creatinine, Ser: 0.58 mg/dL (ref 0.44–1.00)
GFR, Estimated: >60 mL/min (ref >60)
Glucose, Bld: 118 mg/dL — ABNORMAL HIGH (ref 70–99)
Potassium: 3.8 mmol/L (ref 3.5–5.1)
Sodium: 140 mmol/L (ref 135–145)
Total Bilirubin: 1 mg/dL (ref 0.0–1.2)
Total Protein: 7.4 g/dL (ref 6.5–8.1)

## 2023-04-03 LAB — TROPONIN I (HIGH SENSITIVITY)
Troponin I (High Sensitivity): <2 ng/L (ref <18)
Troponin I (High Sensitivity): 3 ng/L (ref <18)

## 2023-04-03 LAB — HCG, SERUM, QUALITATIVE: Preg, Serum: NEGATIVE

## 2023-04-03 MED ORDER — IOHEXOL 350 MG/ML SOLN
100.0000 mL | Freq: Once | INTRAVENOUS | Status: AC | PRN
  Administered 2023-04-03: 100 mL via INTRAVENOUS

## 2023-04-03 MED ORDER — ASPIRIN 81 MG PO CHEW
324.0000 mg | CHEWABLE_TABLET | Freq: Once | ORAL | Status: AC
  Administered 2023-04-03: 324 mg via ORAL
  Filled 2023-04-03: qty 4

## 2023-04-03 MED ORDER — NITROGLYCERIN 0.4 MG SL SUBL
0.4000 mg | SUBLINGUAL_TABLET | SUBLINGUAL | Status: DC | PRN
Start: 2023-04-03 — End: 2023-04-03
  Administered 2023-04-03: 0.4 mg via SUBLINGUAL
  Filled 2023-04-03: qty 1

## 2023-04-03 NOTE — ED Provider Notes (Signed)
[place] [facility] AT [facility] Provider Note   CSN: [id number] Arrival date & time: 04/03/23  4098     History  Chief Complaint  Patient presents with   Chest Pain    Amanda Roth is a 48 y.o. female.  HPI 48 year old female with a history of hypertension presents with chest pain.  Symptoms started around 5:40 AM.  They woke her up from sleep.  She has had chest pain, feeling hot, nauseous.  The chest pain feels like a squeezing and is radiating straight through to her back.  No recent illness or leg swelling.  No shortness of breath.  She felt like her symptoms were worse when she was walking around both at home and coming into the ER.  Pain is about a 7 out of 10.  It is not as bad as when it first started.  She has a history of hypertension though has not taken her HCTZ in the last 1-2 weeks.  No smoking history.  States her mom died of a heart attack around age 11.  Home Medications Prior to Admission medications   Medication Sig Start Date End Date Taking? Authorizing Provider  escitalopram (LEXAPRO) 20 MG tablet Take 20 mg by mouth at bedtime. 04/24/15  Yes [provider]  hydrochlorothiazide (HYDRODIURIL) 25 MG tablet Take 25 mg by mouth daily.   Yes [provider]  Multiple Vitamin (MULTIVITAMIN) tablet Take 1 tablet by mouth daily.   Yes [provider]  pantoprazole (PROTONIX) 20 MG tablet Take 20 mg by mouth daily.   Yes [provider]      Allergies    Patient has no known allergies.    Review of Systems   Review of Systems  Constitutional:  Negative for fever.  Respiratory:  Negative for shortness of breath.   Cardiovascular:  Positive for chest pain. Negative for leg swelling.  Gastrointestinal:  Negative for abdominal pain.  Musculoskeletal:  Positive for back pain.    Physical Exam Updated Vital Signs BP 115/65   Pulse 60   Temp 98.2 F (36.8 C) (Oral)   Resp 17   Ht 5\' 2"  (1.575 m)    Wt 83.9 kg   LMP 07/13/2022 (Approximate)   SpO2 100%   BMI 33.84 kg/m  Physical Exam Vitals and nursing note reviewed.  Constitutional:      General: She is not in acute distress.    Appearance: She is well-developed. She is not ill-appearing or diaphoretic.  HENT:     Head: Normocephalic and atraumatic.  Cardiovascular:     Rate and Rhythm: Normal rate and regular rhythm.     Heart sounds: Normal heart sounds.  Pulmonary:     Effort: Pulmonary effort is normal.     Breath sounds: Normal breath sounds.  Abdominal:     Palpations: Abdomen is soft.     Tenderness: There is no abdominal tenderness.  Skin:    General: Skin is warm and dry.  Neurological:     Mental Status: She is alert.     ED Results / Procedures / Treatments   Labs (all labs ordered are listed, but only abnormal results are displayed) Labs Reviewed  COMPREHENSIVE METABOLIC PANEL - Abnormal; Notable for the following components:      Result Value   Glucose, Bld 118 (*)    Calcium 8.6 (*)    All other components within normal limits  CBC WITH DIFFERENTIAL/PLATELET - Abnormal; Notable for the following components:  RDW 16.1 (*)    All other components within normal limits  HCG, SERUM, QUALITATIVE  TROPONIN I (HIGH SENSITIVITY)  TROPONIN I (HIGH SENSITIVITY)    EKG EKG Interpretation Date/Time:  Thursday April 03 2023 11:12:47 EST Ventricular Rate:  56 PR Interval:  160 QRS Duration:  88 QT Interval:  426 QTC Calculation: 412 R Axis:   21  Text Interpretation: Sinus rhythm Low voltage, precordial leads Borderline T abnormalities, anterior leads no significant change since earlier in the day Confirmed by Pricilla Loveless [phone]) on 04/03/2023 12:04:41 PM  Radiology CT Angio Chest/Abd/Pel for Dissection W and/or Wo Contrast Result Date: 04/03/2023 CLINICAL DATA:  Acute aortic syndrome (AAS) suspected EXAM: CT ANGIOGRAPHY CHEST, ABDOMEN AND PELVIS TECHNIQUE: Non-contrast CT of the chest was  initially obtained. Multidetector CT imaging through the chest, abdomen and pelvis was performed using the standard protocol during bolus administration of intravenous contrast. Multiplanar reconstructed images and MIPs were obtained and reviewed to evaluate the vascular anatomy. RADIATION DOSE REDUCTION: This exam was performed according to the departmental dose-optimization program which includes automated exposure control, adjustment of the mA and/or kV according to patient size and/or use of iterative reconstruction technique. CONTRAST:  OMNIPAQUE IOHEXOL 350 MG/ML SOLN COMPARISON:  None Available. FINDINGS: CTA CHEST FINDINGS Cardiovascular: Limited evaluation of the aortic root secondary to cardiac motion. Preferential opacification of the thoracic aorta. No evidence of thoracic aortic aneurysm, intramural hematoma or dissection. Normal heart size. No pericardial effusion. Mediastinum/Nodes: Visualized thyroid is unremarkable. No axillary or mediastinal adenopathy. Lungs/Pleura: No pleural effusion or pneumothorax. No suspicious pulmonary nodules or masses. No focal consolidation. Musculoskeletal: Mixed lucent and sclerotic area of the posterior T3 vertebral body; posterior endplate is not conspicuous in portions; there are well-defined sclerotic margins (series 1, image 121).; Review of the MIP images confirms the above findings. CTA ABDOMEN AND PELVIS FINDINGS VASCULAR Aorta: Normal caliber aorta without aneurysm, dissection, vasculitis or significant stenosis. Celiac: Patent without evidence of aneurysm, dissection, vasculitis or significant stenosis. SMA: Patent without evidence of aneurysm, dissection, vasculitis or significant stenosis. Renals: Both renal arteries are patent without evidence of aneurysm, dissection, vasculitis, fibromuscular dysplasia or significant stenosis. IMA: Patent without evidence of aneurysm, dissection, vasculitis or significant stenosis. Inflow: Patent without evidence of  aneurysm, dissection, vasculitis or significant stenosis. Veins: No obvious venous abnormality within the limitations of this arterial phase study. Review of the MIP images confirms the above findings. NON-VASCULAR Hepatobiliary: No focal hepatic abnormality. Suspect underlying hepatic steatosis with focal fatty sparing along the gallbladder fossa. Subjective prominence of the gallbladder wall which could reflect underlying adenomyosis or chronic inflammatory changes. No pericholecystic fat stranding. No extrahepatic biliary ductal dilation. Pancreas: Unremarkable. No pancreatic ductal dilatation or surrounding inflammatory changes. Spleen: Normal in size without focal abnormality. Adrenals/Urinary Tract: Adrenal glands are unremarkable. Kidneys are normal, without renal calculi, focal lesion, or hydronephrosis. Bladder is unremarkable. Stomach/Bowel: No evidence of bowel obstruction. Appendix is surgically absent. Stomach is decompressed and unremarkable. Lymphatic: No suspicious lymphadenopathy. Reproductive: Uterus and bilateral adnexa are unremarkable. Metallic density along the anterior aspect of the uterus is favored to reflect the displaced LEFT-sided tubal ligation clip. Other: No free air or free fluid. Musculoskeletal: No acute or significant osseous findings. Favored bone island of L5. Review of the MIP images confirms the above findings. IMPRESSION: 1. No evidence of aortic aneurysm, dissection or intramural hematoma. 2. Mixed lucent and sclerotic area of the posterior T3 vertebral body. This is favored to reflect a benign process such as a  hemangioma. Recommend correlation with any prior outside imaging to assess for stability. Given history of back pain in this area, if no prior outside imaging is available, recommend dedicated MRI of the thoracic spine for further evaluation. 3. Suspect underlying hepatic steatosis. 4. Subjective prominence of the gallbladder wall which could reflect underlying  adenomyomatosis or chronic inflammatory changes. No pericholecystic fat stranding. If there is a persistent clinical concern for acute cholecystitis, consider dedicated RIGHT upper quadrant ultrasound or HIDA scan. Electronically Signed   By: Meda Klinefelter M.D.   On: 04/03/2023 11:36   DG Chest Portable 1 View Result Date: 04/03/2023 CLINICAL DATA:  Chest and back pain EXAM: PORTABLE CHEST 1 VIEW COMPARISON:  None Available. FINDINGS: The heart size and mediastinal contours are within normal limits. Both lungs are clear. The visualized skeletal structures are unremarkable. IMPRESSION: No active disease. Electronically Signed   By: Sharlet Salina M.D.   On: 04/03/2023 08:57    Procedures Procedures    Medications Ordered in ED Medications  nitroGLYCERIN (NITROSTAT) SL tablet 0.4 mg (0.4 mg Sublingual Given 04/03/23 0844)  aspirin chewable tablet 324 mg (324 mg Oral Given 04/03/23 0843)  iohexol (OMNIPAQUE) 350 MG/ML injection 100 mL (100 mLs Intravenous Contrast Given 04/03/23 1055)    ED Course/ Medical Decision Making/ A&P                                 Medical Decision Making Amount and/or Complexity of Data Reviewed Labs: ordered.    Details: 2 negative troponins Radiology: ordered and independent interpretation performed.    Details: No aortic dissection ECG/medicine tests: ordered and independent interpretation performed.    Details: Nonspecific T waves, no dynamic changes on repeat ECGs  Risk OTC drugs. Prescription drug management.   Patient's chest pain has completely resolved as well as her back pain after some nitro.  Given the complaint of pain radiate into her back a CTA was obtained but there is no evidence of dissection.  She has a couple risk factors for chest pain/cardiac disease including her hypertension and family history of mom's CAD.  Heart score is a 4.  She is pain-free now.  She had some concerning aspects such as her mom's history and her exertional  component, but she also did not have any diaphoresis, radiation, shortness of breath, etc.  Had shared decision-making with patient and husband.  She prefers to go home with outpatient cardiology follow-up.  We discussed that there is no MI today but CAD has not been ruled out and she needs to return if any new or worsening symptoms recur.  Will discharge home with return precautions.        Final Clinical Impression(s) / ED Diagnoses Final diagnoses:  Nonspecific chest pain  Lesion of bone of thoracic spine    Rx / DC Orders ED Discharge Orders          Ordered    Ambulatory referral to Cardiology       Comments: If you have not heard from the Cardiology office within the next 72 hours please call [phone].   04/03/23 1235              Pricilla Loveless, MD 04/03/23 1452

## 2023-04-03 NOTE — ED Triage Notes (Signed)
Pt reported chest pain woke her up out of her sleep accompanied nausea, hot flashes, discomfort, high blood pressure, and pain in her back between shoulder blades at 7/10.

## 2023-04-03 NOTE — Discharge Instructions (Signed)
Your workup today shows that you did not have a heart attack but cannot fully rule out all heart disease.  Thus you need to follow-up with cardiology, who you are being referred to, as well as your primary care physician.  Your CT scan showed an abnormal spot/lesion on the bone of T3 in your spine.  You will need to get an outpatient MRI to evaluate this.  If you develop recurrent, continued, or worsening chest pain, shortness of breath, fever, vomiting, abdominal or back pain, or any other new/concerning symptoms then return to the ER for evaluation.

## 2023-04-03 NOTE — ED Notes (Signed)
 Patient transported to CT

## 2023-04-04 ENCOUNTER — Other Ambulatory Visit (HOSPITAL_COMMUNITY): Payer: Self-pay | Admitting: Nurse Practitioner

## 2023-04-04 DIAGNOSIS — Z1231 Encounter for screening mammogram for malignant neoplasm of breast: Secondary | ICD-10-CM

## 2023-04-07 ENCOUNTER — Ambulatory Visit (HOSPITAL_COMMUNITY)
Admission: RE | Admit: 2023-04-07 | Discharge: 2023-04-07 | Disposition: A | Discharge status: Home / Self Care | Payer: BC Managed Care – PPO | Source: Ambulatory Visit | Attending: Nurse Practitioner | Admitting: Nurse Practitioner

## 2023-04-07 ENCOUNTER — Encounter (HOSPITAL_COMMUNITY): Payer: Self-pay

## 2023-04-07 DIAGNOSIS — Z1231 Encounter for screening mammogram for malignant neoplasm of breast: Secondary | ICD-10-CM | POA: Insufficient documentation

## 2023-04-16 ENCOUNTER — Other Ambulatory Visit: Payer: Self-pay | Admitting: Medical Genetics

## 2023-04-17 ENCOUNTER — Other Ambulatory Visit (HOSPITAL_COMMUNITY): Payer: Self-pay | Admitting: Nurse Practitioner

## 2023-04-17 DIAGNOSIS — M546 Pain in thoracic spine: Secondary | ICD-10-CM

## 2023-04-21 ENCOUNTER — Other Ambulatory Visit (HOSPITAL_COMMUNITY): Payer: Self-pay

## 2023-04-25 ENCOUNTER — Other Ambulatory Visit (HOSPITAL_COMMUNITY)
Admission: RE | Admit: 2023-04-25 | Discharge: 2023-04-25 | Disposition: A | Discharge status: Home / Self Care | Payer: Self-pay | Source: Ambulatory Visit | Attending: Medical Genetics | Admitting: Medical Genetics

## 2023-04-25 ENCOUNTER — Ambulatory Visit (HOSPITAL_COMMUNITY)
Admission: RE | Admit: 2023-04-25 | Discharge: 2023-04-25 | Disposition: A | Discharge status: Home / Self Care | Payer: Self-pay | Source: Ambulatory Visit | Attending: Nurse Practitioner | Admitting: Nurse Practitioner

## 2023-04-25 ENCOUNTER — Encounter (HOSPITAL_COMMUNITY): Payer: Self-pay | Admitting: Radiology

## 2023-04-25 DIAGNOSIS — M546 Pain in thoracic spine: Secondary | ICD-10-CM | POA: Diagnosis present

## 2023-05-06 LAB — GENECONNECT MOLECULAR SCREEN: Genetic Analysis Overall Interpretation: NEGATIVE

## 2023-05-23 ENCOUNTER — Encounter: Payer: Self-pay | Admitting: Internal Medicine

## 2023-05-23 ENCOUNTER — Ambulatory Visit: Payer: BC Managed Care – PPO | Attending: Internal Medicine | Admitting: Internal Medicine

## 2023-05-23 NOTE — Progress Notes (Signed)
 Erroneous encounter - please disregard.
